# Patient Record
Sex: Male | Born: 1940 | Race: White | Hispanic: No
Health system: Southern US, Community
[De-identification: ages and names within clinical notes are randomized; demographics above are authoritative.]

## PROBLEM LIST (undated history)

## (undated) HISTORY — PX: PACEMAKER INSERTION: SHX728

---

## 2014-11-06 ENCOUNTER — Other Ambulatory Visit (HOSPITAL_COMMUNITY): Payer: Medicare HMO

## 2014-11-06 ENCOUNTER — Inpatient Hospital Stay
Admission: AD | Admit: 2014-11-06 | Discharge: 2014-12-12 | Disposition: A | Discharge status: Home Health | Payer: Medicare HMO | Source: Ambulatory Visit | Attending: Internal Medicine | Admitting: Internal Medicine

## 2014-11-06 DIAGNOSIS — R7989 Other specified abnormal findings of blood chemistry: Secondary | ICD-10-CM | POA: Insufficient documentation

## 2014-11-06 DIAGNOSIS — N179 Acute kidney failure, unspecified: Secondary | ICD-10-CM

## 2014-11-06 DIAGNOSIS — M009 Pyogenic arthritis, unspecified: Secondary | ICD-10-CM

## 2014-11-06 DIAGNOSIS — Z95828 Presence of other vascular implants and grafts: Secondary | ICD-10-CM

## 2014-11-06 DIAGNOSIS — M549 Dorsalgia, unspecified: Secondary | ICD-10-CM

## 2014-11-06 DIAGNOSIS — R748 Abnormal levels of other serum enzymes: Secondary | ICD-10-CM

## 2014-11-06 DIAGNOSIS — R609 Edema, unspecified: Secondary | ICD-10-CM

## 2014-11-06 DIAGNOSIS — Z8739 Personal history of other diseases of the musculoskeletal system and connective tissue: Secondary | ICD-10-CM

## 2014-11-06 DIAGNOSIS — K5649 Other impaction of intestine: Secondary | ICD-10-CM

## 2014-11-06 DIAGNOSIS — D696 Thrombocytopenia, unspecified: Secondary | ICD-10-CM | POA: Insufficient documentation

## 2014-11-06 DIAGNOSIS — K2971 Gastritis, unspecified, with bleeding: Secondary | ICD-10-CM | POA: Insufficient documentation

## 2014-11-06 DIAGNOSIS — D469 Myelodysplastic syndrome, unspecified: Secondary | ICD-10-CM | POA: Insufficient documentation

## 2014-11-06 DIAGNOSIS — K2970 Gastritis, unspecified, without bleeding: Secondary | ICD-10-CM | POA: Insufficient documentation

## 2014-11-06 DIAGNOSIS — R195 Other fecal abnormalities: Secondary | ICD-10-CM | POA: Insufficient documentation

## 2014-11-06 DIAGNOSIS — R945 Abnormal results of liver function studies: Secondary | ICD-10-CM

## 2014-11-06 DIAGNOSIS — R52 Pain, unspecified: Secondary | ICD-10-CM

## 2014-11-06 DIAGNOSIS — Z789 Other specified health status: Secondary | ICD-10-CM

## 2014-11-06 DIAGNOSIS — L0291 Cutaneous abscess, unspecified: Secondary | ICD-10-CM

## 2014-11-06 DIAGNOSIS — R1011 Right upper quadrant pain: Secondary | ICD-10-CM | POA: Insufficient documentation

## 2014-11-06 DIAGNOSIS — J189 Pneumonia, unspecified organism: Secondary | ICD-10-CM

## 2014-11-06 DIAGNOSIS — D62 Acute posthemorrhagic anemia: Secondary | ICD-10-CM | POA: Insufficient documentation

## 2014-11-07 LAB — CBC
HEMATOCRIT: 25.1 % — AB (ref 39.0–52.0)
HEMOGLOBIN: 8.3 g/dL — AB (ref 13.0–17.0)
MCH: 29.1 pg (ref 26.0–34.0)
MCHC: 33.1 g/dL (ref 30.0–36.0)
MCV: 88.1 fL (ref 78.0–100.0)
Platelets: 16 10*3/uL — CL (ref 150–400)
RBC: 2.85 MIL/uL — ABNORMAL LOW (ref 4.22–5.81)
RDW: 17.7 % — ABNORMAL HIGH (ref 11.5–15.5)
WBC: 15.7 10*3/uL — ABNORMAL HIGH (ref 4.0–10.5)

## 2014-11-07 LAB — URINALYSIS, ROUTINE W REFLEX MICROSCOPIC
BILIRUBIN URINE: NEGATIVE
Glucose, UA: NEGATIVE mg/dL
Hgb urine dipstick: NEGATIVE
KETONES UR: NEGATIVE mg/dL
Leukocytes, UA: NEGATIVE
NITRITE: NEGATIVE
PROTEIN: NEGATIVE mg/dL
Specific Gravity, Urine: 1.012 (ref 1.005–1.030)
UROBILINOGEN UA: 2 mg/dL — AB (ref 0.0–1.0)
pH: 6 (ref 5.0–8.0)

## 2014-11-07 LAB — ABO/RH: ABO/RH(D): A POS

## 2014-11-07 LAB — COMPREHENSIVE METABOLIC PANEL
ALBUMIN: 2.4 g/dL — AB (ref 3.5–5.0)
ALT: 19 U/L (ref 17–63)
AST: 19 U/L (ref 15–41)
Alkaline Phosphatase: 97 U/L (ref 38–126)
Anion gap: 8 (ref 5–15)
BUN: 18 mg/dL (ref 6–20)
CHLORIDE: 98 mmol/L — AB (ref 101–111)
CO2: 27 mmol/L (ref 22–32)
CREATININE: 0.7 mg/dL (ref 0.61–1.24)
Calcium: 9 mg/dL (ref 8.9–10.3)
GFR calc Af Amer: >60 mL/min (ref >60)
GFR calc non Af Amer: >60 mL/min (ref >60)
Glucose, Bld: 96 mg/dL (ref 65–99)
POTASSIUM: 4.2 mmol/L (ref 3.5–5.1)
SODIUM: 133 mmol/L — AB (ref 135–145)
Total Bilirubin: 1.9 mg/dL — ABNORMAL HIGH (ref 0.3–1.2)
Total Protein: 7 g/dL (ref 6.5–8.1)

## 2014-11-07 LAB — CK: Total CK: 17 U/L — ABNORMAL LOW (ref 49–397)

## 2014-11-07 LAB — PROTIME-INR
INR: 1.32 (ref 0.00–1.49)
Prothrombin Time: 16.5 seconds — ABNORMAL HIGH (ref 11.6–15.2)

## 2014-11-07 LAB — PROCALCITONIN: PROCALCITONIN: 0.19 ng/mL

## 2014-11-07 LAB — PHOSPHORUS: Phosphorus: 4.7 mg/dL — ABNORMAL HIGH (ref 2.5–4.6)

## 2014-11-07 LAB — MAGNESIUM: MAGNESIUM: 1.9 mg/dL (ref 1.7–2.4)

## 2014-11-07 LAB — TSH: TSH: 2.396 u[IU]/mL (ref 0.350–4.500)

## 2014-11-07 LAB — PREPARE RBC (CROSSMATCH)

## 2014-11-07 LAB — SEDIMENTATION RATE: Sed Rate: >140 mm/hr — ABNORMAL HIGH (ref 0–16)

## 2014-11-07 LAB — C-REACTIVE PROTEIN: CRP: 7.5 mg/dL — AB (ref <1.0)

## 2014-11-08 ENCOUNTER — Other Ambulatory Visit (HOSPITAL_COMMUNITY): Payer: Medicare HMO

## 2014-11-08 LAB — TYPE AND SCREEN
ABO/RH(D): A POS
Antibody Screen: NEGATIVE
UNIT DIVISION: 0
Unit division: 0

## 2014-11-08 LAB — CBC WITH DIFFERENTIAL/PLATELET
BASOS ABS: 0 10*3/uL (ref 0.0–0.1)
BASOS ABS: 0 10*3/uL (ref 0.0–0.1)
Basophils Relative: 0 %
Basophils Relative: 0 %
EOS ABS: 0 10*3/uL (ref 0.0–0.7)
EOS ABS: 0 10*3/uL (ref 0.0–0.7)
EOS PCT: 0 %
Eosinophils Relative: 0 %
HCT: 20.2 % — ABNORMAL LOW (ref 39.0–52.0)
HEMATOCRIT: 24.4 % — AB (ref 39.0–52.0)
Hemoglobin: 6.7 g/dL — CL (ref 13.0–17.0)
Hemoglobin: 8.3 g/dL — ABNORMAL LOW (ref 13.0–17.0)
LYMPHS ABS: 0.4 10*3/uL — AB (ref 0.7–4.0)
LYMPHS ABS: 0.6 10*3/uL — AB (ref 0.7–4.0)
Lymphocytes Relative: 4 %
Lymphocytes Relative: 5 %
MCH: 29.6 pg (ref 26.0–34.0)
MCH: 30.1 pg (ref 26.0–34.0)
MCHC: 33.2 g/dL (ref 30.0–36.0)
MCHC: 34 g/dL (ref 30.0–36.0)
MCV: 89.4 fL (ref 78.0–100.0)
MCV: 88.4 fL (ref 78.0–100.0)
MONO ABS: 0.1 10*3/uL (ref 0.1–1.0)
MONO ABS: 0.1 10*3/uL (ref 0.1–1.0)
Monocytes Relative: 1 %
Monocytes Relative: 1 %
NEUTROS ABS: 10.3 10*3/uL — AB (ref 1.7–7.7)
NEUTROS PCT: 95 %
Neutro Abs: 10.5 10*3/uL — ABNORMAL HIGH (ref 1.7–7.7)
Neutrophils Relative %: 94 %
PLATELETS: 17 10*3/uL — AB (ref 150–400)
PLATELETS: 16 10*3/uL — AB (ref 150–400)
RBC: 2.26 MIL/uL — AB (ref 4.22–5.81)
RBC: 2.76 MIL/uL — AB (ref 4.22–5.81)
RDW: 18.1 % — AB (ref 11.5–15.5)
RDW: 18 % — AB (ref 11.5–15.5)
WBC: 11 10*3/uL — AB (ref 4.0–10.5)
WBC: 11 10*3/uL — AB (ref 4.0–10.5)

## 2014-11-08 LAB — BASIC METABOLIC PANEL
ANION GAP: 8 (ref 5–15)
BUN: 18 mg/dL (ref 6–20)
CALCIUM: 8.5 mg/dL — AB (ref 8.9–10.3)
CO2: 27 mmol/L (ref 22–32)
CREATININE: 0.74 mg/dL (ref 0.61–1.24)
Chloride: 94 mmol/L — ABNORMAL LOW (ref 101–111)
Glucose, Bld: 97 mg/dL (ref 65–99)
Potassium: 4 mmol/L (ref 3.5–5.1)
Sodium: 129 mmol/L — ABNORMAL LOW (ref 135–145)

## 2014-11-08 LAB — HEMOGLOBIN A1C
HEMOGLOBIN A1C: 6.3 % — AB (ref 4.8–5.6)
MEAN PLASMA GLUCOSE: 134 mg/dL

## 2014-11-08 LAB — OCCULT BLOOD X 1 CARD TO LAB, STOOL: Fecal Occult Bld: POSITIVE — AB

## 2014-11-08 LAB — MAGNESIUM: Magnesium: 1.9 mg/dL (ref 1.7–2.4)

## 2014-11-08 LAB — PHOSPHORUS: PHOSPHORUS: 4.5 mg/dL (ref 2.5–4.6)

## 2014-11-08 NOTE — Consult Note (Signed)
Reason for Consult:rule out sepsis left knee status post irrigation and debridement at [facility] approximately one month ago. Referring Physician: Dr Jaynie Bream is an 74 y.o. male.  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 Patient was hospitalized for an infected Port-A-Cath on the right. Patient had multiple complications during his hospital stay including cellulitis of the right elbow and an effusion of the left knee. Patient underwent arthroscopic irrigation and debridement left knee. His white cell count in the knee was 10,000 cultures were negative. Patient reports a 2-1/2 year history of arthritis of the left knee and was told he was not a surgical candidate for total knee replacement due to his multiple medical problems.  No past medical history on file.  No past surgical history on file.  No family history on file.  Social History:  has no tobacco, alcohol, and drug history on file.  Allergies: Allergies not on file  Medications: I have reviewed the patient's current medications.  Results for orders placed or performed during the hospital encounter of 11/06/14 (from the past 48 hour(s))  CBC with Differential/Platelet     Status: Abnormal   Collection Time: 11/07/14  5:40 AM  Result Value Ref Range   WBC 11.0 (H) 4.0 - 10.5 K/uL    Comment: REPEATED TO VERIFY   RBC 2.26 (L) 4.22 - 5.81 MIL/uL   Hemoglobin 6.7 (LL) 13.0 - 17.0 g/dL    Comment: SPECIMEN CHECKED FOR CLOTS REPEATED TO VERIFY CRITICAL RESULT CALLED TO, READ BACK BY AND VERIFIED WITH: CHAVIS, ASHLEY,RN 0802 11/07/2014 BY MACEDA, J    HCT 20.2 (L) 39.0 - 52.0 %   MCV 89.4 78.0 - 100.0 fL   MCH 29.6 26.0 - 34.0 pg   MCHC 33.2 30.0 - 36.0 g/dL   RDW 18.1 (H) 11.5 - 15.5 %   Platelets 17 (LL) 150 - 400 K/uL    Comment: PLATELET COUNT CONFIRMED BY SMEAR REPEATED TO VERIFY CRITICAL RESULT CALLED TO, READ BACK BY AND VERIFIED WITH: ASHLEY CHAVIS,RN AT 0756 BY J  MACEDA SPECIMEN CHECKED FOR CLOTS    Neutrophils Relative % 95 %   Lymphocytes Relative 4 %   Monocytes Relative 1 %   Eosinophils Relative 0 %   Basophils Relative 0 %   Neutro Abs 10.5 (H) 1.7 - 7.7 K/uL   Lymphs Abs 0.4 (L) 0.7 - 4.0 K/uL   Monocytes Absolute 0.1 0.1 - 1.0 K/uL   Eosinophils Absolute 0.0 0.0 - 0.7 K/uL   Basophils Absolute 0.0 0.0 - 0.1 K/uL   WBC Morphology DOHLE BODIES     Comment: HYPERSEGMENTED NEUT  Comprehensive metabolic panel     Status: Abnormal   Collection Time: 11/07/14  5:40 AM  Result Value Ref Range   Sodium 133 (L) 135 - 145 mmol/L   Potassium 4.2 3.5 - 5.1 mmol/L   Chloride 98 (L) 101 - 111 mmol/L   CO2 27 22 - 32 mmol/L   Glucose, Bld 96 65 - 99 mg/dL   BUN 18 6 - 20 mg/dL   Creatinine, Ser 0.70 0.61 - 1.24 mg/dL   Calcium 9.0 8.9 - 10.3 mg/dL   Total Protein 7.0 6.5 - 8.1 g/dL   Albumin 2.4 (L) 3.5 - 5.0 g/dL   AST 19 15 - 41 U/L   ALT 19 17 - 63 U/L   Alkaline Phosphatase 97 38 - 126 U/L   Total Bilirubin 1.9 (H) 0.3 - 1.2 mg/dL   GFR  calc non Af Amer >60 >60 mL/min   GFR calc Af Amer >60 >60 mL/min    Comment: (NOTE) The eGFR has been calculated using the CKD EPI equation. This calculation has not been validated in all clinical situations. eGFR's persistently <60 mL/min signify possible Chronic Kidney Disease.    Anion gap 8 5 - 15  Magnesium     Status: None   Collection Time: 11/07/14  5:40 AM  Result Value Ref Range   Magnesium 1.9 1.7 - 2.4 mg/dL  Phosphorus     Status: Abnormal   Collection Time: 11/07/14  5:40 AM  Result Value Ref Range   Phosphorus 4.7 (H) 2.5 - 4.6 mg/dL  Protime-INR     Status: Abnormal   Collection Time: 11/07/14  5:40 AM  Result Value Ref Range   Prothrombin Time 16.5 (H) 11.6 - 15.2 seconds   INR 1.32 0.00 - 1.49  C-reactive protein     Status: Abnormal   Collection Time: 11/07/14  5:40 AM  Result Value Ref Range   CRP 7.5 (H) <1.0 mg/dL  TSH     Status: None   Collection Time: 11/07/14   5:40 AM  Result Value Ref Range   TSH 2.396 0.350 - 4.500 uIU/mL  Sedimentation rate     Status: Abnormal   Collection Time: 11/07/14  5:40 AM  Result Value Ref Range   Sed Rate >140 (H) 0 - 16 mm/hr  Hemoglobin A1c     Status: Abnormal   Collection Time: 11/07/14  5:40 AM  Result Value Ref Range   Hgb A1c MFr Bld 6.3 (H) 4.8 - 5.6 %    Comment: (NOTE)         Pre-diabetes: 5.7 - 6.4         Diabetes: >6.4         Glycemic control for adults with diabetes: <7.0    Mean Plasma Glucose 134 mg/dL    Comment: (NOTE) Performed At: [facility] [place], [place] [id number] Lindon Romp MD [id number]   CK     Status: Abnormal   Collection Time: 11/07/14  5:40 AM  Result Value Ref Range   Total CK 17 (L) 49 - 397 U/L  Procalcitonin     Status: None   Collection Time: 11/07/14  5:40 AM  Result Value Ref Range   Procalcitonin 0.19 ng/mL    Comment:        Interpretation: PCT (Procalcitonin) <= 0.5 ng/mL: Systemic infection (sepsis) is not likely. Local bacterial infection is possible. (NOTE)         ICU PCT Algorithm               Non ICU PCT Algorithm    ----------------------------     ------------------------------         PCT < 0.25 ng/mL                 PCT < 0.1 ng/mL     Stopping of antibiotics            Stopping of antibiotics       strongly encouraged.               strongly encouraged.    ----------------------------     ------------------------------       PCT level decrease by               PCT < 0.25 ng/mL       >= 80% from  peak PCT       OR PCT 0.25 - 0.5 ng/mL          Stopping of antibiotics                                             encouraged.     Stopping of antibiotics           encouraged.    ----------------------------     ------------------------------       PCT level decrease by              PCT >= 0.25 ng/mL       < 80% from peak PCT        AND PCT >= 0.5 ng/mL            Continuin g antibiotics                                               encouraged.       Continuing antibiotics            encouraged.    ----------------------------     ------------------------------     PCT level increase compared          PCT > 0.5 ng/mL         with peak PCT AND          PCT >= 0.5 ng/mL             Escalation of antibiotics                                          strongly encouraged.      Escalation of antibiotics        strongly encouraged.   Urinalysis, Routine w reflex microscopic (not at [facility])     Status: Abnormal   Collection Time: 11/07/14  6:16 AM  Result Value Ref Range   Color, Urine YELLOW YELLOW   APPearance CLEAR CLEAR   Specific Gravity, Urine 1.012 1.005 - 1.030   pH 6.0 5.0 - 8.0   Glucose, UA NEGATIVE NEGATIVE mg/dL   Hgb urine dipstick NEGATIVE NEGATIVE   Bilirubin Urine NEGATIVE NEGATIVE   Ketones, ur NEGATIVE NEGATIVE mg/dL   Protein, ur NEGATIVE NEGATIVE mg/dL   Urobilinogen, UA 2.0 (H) 0.0 - 1.0 mg/dL   Nitrite NEGATIVE NEGATIVE   Leukocytes, UA NEGATIVE NEGATIVE    Comment: MICROSCOPIC NOT DONE ON URINES WITH NEGATIVE PROTEIN, BLOOD, LEUKOCYTES, NITRITE, OR GLUCOSE <1000 mg/dL.  Type and screen [facility]     Status: None (Preliminary result)   Collection Time: 11/07/14 10:57 AM  Result Value Ref Range   ABO/RH(D) A POS    Antibody Screen NEG    Sample Expiration 11/10/2014    Unit Number [id number]    Blood Component Type RED CELLS,LR    Unit division 00    Status of Unit ISSUED    Transfusion Status OK TO TRANSFUSE    Crossmatch Result Compatible    Unit Number [id number]    Blood Component Type RBC LR PHER1    Unit division 00    Status of  Unit ISSUED    Transfusion Status OK TO TRANSFUSE    Crossmatch Result Compatible   Prepare RBC     Status: None   Collection Time: 11/07/14 10:57 AM  Result Value Ref Range   Order Confirmation ORDER PROCESSED BY BLOOD BANK   ABO/Rh     Status: None   Collection Time: 11/07/14 10:57 AM  Result Value Ref Range    ABO/RH(D) A POS   CBC     Status: Abnormal   Collection Time: 11/07/14  8:00 PM  Result Value Ref Range   WBC 15.7 (H) 4.0 - 10.5 K/uL   RBC 2.85 (L) 4.22 - 5.81 MIL/uL   Hemoglobin 8.3 (L) 13.0 - 17.0 g/dL   HCT 25.1 (L) 39.0 - 52.0 %   MCV 88.1 78.0 - 100.0 fL   MCH 29.1 26.0 - 34.0 pg   MCHC 33.1 30.0 - 36.0 g/dL   RDW 17.7 (H) 11.5 - 15.5 %   Platelets 16 (LL) 150 - 400 K/uL    Comment: REPEATED TO VERIFY CRITICAL VALUE NOTED.  VALUE IS CONSISTENT WITH PREVIOUSLY REPORTED AND CALLED VALUE.   CBC with Differential/Platelet     Status: Abnormal (Preliminary result)   Collection Time: 11/08/14  6:00 AM  Result Value Ref Range   WBC 11.0 (H) 4.0 - 10.5 K/uL   RBC 2.76 (L) 4.22 - 5.81 MIL/uL   Hemoglobin 8.3 (L) 13.0 - 17.0 g/dL   HCT 24.4 (L) 39.0 - 52.0 %   MCV 88.4 78.0 - 100.0 fL   MCH 30.1 26.0 - 34.0 pg   MCHC 34.0 30.0 - 36.0 g/dL   RDW 18.0 (H) 11.5 - 15.5 %   Platelets 16 (LL) 150 - 400 K/uL    Comment: REPEATED TO VERIFY CRITICAL VALUE NOTED.  VALUE IS CONSISTENT WITH PREVIOUSLY REPORTED AND CALLED VALUE.    Neutrophils Relative % PENDING %   Neutro Abs PENDING 1.7 - 7.7 K/uL   Band Neutrophils PENDING %   Lymphocytes Relative PENDING %   Lymphs Abs PENDING 0.7 - 4.0 K/uL   Monocytes Relative PENDING %   Monocytes Absolute PENDING 0.1 - 1.0 K/uL   Eosinophils Relative PENDING %   Eosinophils Absolute PENDING 0.0 - 0.7 K/uL   Basophils Relative PENDING %   Basophils Absolute PENDING 0.0 - 0.1 K/uL   WBC Morphology PENDING    RBC Morphology PENDING    Smear Review PENDING    nRBC PENDING 0 /100 WBC   Metamyelocytes Relative PENDING %   Myelocytes PENDING %   Promyelocytes Absolute PENDING %   Blasts PENDING %    Dg Chest Port 1 View  11/06/2014  CLINICAL DATA:  74 year old male recently status post chest wall debridement. Initial encounter. EXAM: PORTABLE CHEST 1 VIEW COMPARISON:  [facility] PICC line image 10 /06/2014, chest  radiographs 10/17/2014. FINDINGS: Portable AP semi upright view at 2246 hours. Right side PICC line remains in place. Left side chest cardiac AICD again noted. Stable cardiac size and mediastinal contours. No pneumothorax, pulmonary edema, pleural effusion or acute pulmonary opacity. Patchy left lateral lung base opacity appears stable since 10/17/14. IMPRESSION: Stable chest.  No acute cardiopulmonary abnormality. Electronically Signed   By: Genevie Ann M.D.   On: 11/06/2014 23:15    Review of Systems  All other systems reviewed and are negative.  There were no vitals taken for this visit. Physical Exam On examination patient's left lower extremity he has swelling around the left  knee compared to the right. There is some warmth there is no redness no cellulitis no drainage patient has some pain with range of motion but there is no tense effusion. Patient is generalized tenderness to palpation along the entire tibial crest on the left. Assessment/Plan: Assessment: Left knee pain with 2-1/2 year history of osteoarthritis with aspiration showing a white cell count of only 10,000 and cultures negative.  Plan: We will order radiographs and an MRI scan of the left knee.. Do not see an indication for further debridement of the left knee at this time.  Zeidy Tayag V 11/08/2014, 7:04 AM

## 2014-11-09 ENCOUNTER — Encounter: Payer: Self-pay | Admitting: Radiology

## 2014-11-09 NOTE — Progress Notes (Signed)
Patient ID: Christopher Hood, male   DOB: 07/23/1940, 74 y.o.   MRN: [id number] I have reviewed the patient's radiographs of his left knee. 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 No bony destructive changes consistent with osteomyelitis. Clinical findings were also consistent with osteoarthritis. MRI pending.

## 2014-11-10 ENCOUNTER — Other Ambulatory Visit (HOSPITAL_COMMUNITY): Payer: Medicare HMO

## 2014-11-10 ENCOUNTER — Encounter: Payer: Self-pay | Admitting: Radiology

## 2014-11-10 LAB — CBC
HCT: 22.9 % — ABNORMAL LOW (ref 39.0–52.0)
Hemoglobin: 7.7 g/dL — ABNORMAL LOW (ref 13.0–17.0)
MCH: 29.8 pg (ref 26.0–34.0)
MCHC: 33.6 g/dL (ref 30.0–36.0)
MCV: 88.8 fL (ref 78.0–100.0)
PLATELETS: 16 10*3/uL — AB (ref 150–400)
RBC: 2.58 MIL/uL — ABNORMAL LOW (ref 4.22–5.81)
RDW: 17.7 % — AB (ref 11.5–15.5)
WBC: 1.5 10*3/uL — ABNORMAL LOW (ref 4.0–10.5)

## 2014-11-10 LAB — CBC WITH DIFFERENTIAL/PLATELET
BASOS ABS: 0 10*3/uL (ref 0.0–0.1)
BASOS PCT: 0 %
EOS ABS: 0 10*3/uL (ref 0.0–0.7)
Eosinophils Relative: 1 %
HEMATOCRIT: 23 % — AB (ref 39.0–52.0)
HEMOGLOBIN: 7.7 g/dL — AB (ref 13.0–17.0)
Lymphocytes Relative: 26 %
Lymphs Abs: 0.4 10*3/uL — ABNORMAL LOW (ref 0.7–4.0)
MCH: 29.8 pg (ref 26.0–34.0)
MCHC: 33.5 g/dL (ref 30.0–36.0)
MCV: 89.1 fL (ref 78.0–100.0)
Monocytes Absolute: 0.1 10*3/uL (ref 0.1–1.0)
Monocytes Relative: 6 %
NEUTROS ABS: 1 10*3/uL — AB (ref 1.7–7.7)
Neutrophils Relative %: 67 %
Platelets: 19 10*3/uL — CL (ref 150–400)
RBC: 2.58 MIL/uL — ABNORMAL LOW (ref 4.22–5.81)
RDW: 17.6 % — AB (ref 11.5–15.5)
WBC: 1.5 10*3/uL — ABNORMAL LOW (ref 4.0–10.5)

## 2014-11-10 LAB — BASIC METABOLIC PANEL
ANION GAP: 9 (ref 5–15)
BUN: 23 mg/dL — ABNORMAL HIGH (ref 6–20)
CALCIUM: 8.6 mg/dL — AB (ref 8.9–10.3)
CO2: 25 mmol/L (ref 22–32)
Chloride: 93 mmol/L — ABNORMAL LOW (ref 101–111)
Creatinine, Ser: 0.77 mg/dL (ref 0.61–1.24)
Glucose, Bld: 101 mg/dL — ABNORMAL HIGH (ref 65–99)
Potassium: 4.1 mmol/L (ref 3.5–5.1)
SODIUM: 127 mmol/L — AB (ref 135–145)

## 2014-11-10 MED ORDER — IOHEXOL 300 MG/ML  SOLN
100.0000 mL | Freq: Once | INTRAMUSCULAR | Status: AC | PRN
  Administered 2014-11-10: 100 mL via INTRAVENOUS

## 2014-11-11 LAB — CBC WITH DIFFERENTIAL/PLATELET
BASOS ABS: 0 10*3/uL (ref 0.0–0.1)
Band Neutrophils: 0 %
Basophils Relative: 0 %
EOS ABS: 0 10*3/uL (ref 0.0–0.7)
Eosinophils Relative: 0 %
HEMATOCRIT: 20.9 % — AB (ref 39.0–52.0)
HEMOGLOBIN: 7.2 g/dL — AB (ref 13.0–17.0)
LYMPHS ABS: 0 10*3/uL — AB (ref 0.7–4.0)
Lymphocytes Relative: 0 %
MCH: 30.3 pg (ref 26.0–34.0)
MCHC: 34.4 g/dL (ref 30.0–36.0)
MCV: 87.8 fL (ref 78.0–100.0)
MONOS PCT: 0 %
Monocytes Absolute: 0 10*3/uL — ABNORMAL LOW (ref 0.1–1.0)
NEUTROS PCT: 0 %
NRBC: 0 /100{WBCs}
Platelets: 14 10*3/uL — CL (ref 150–400)
RBC: 2.38 MIL/uL — AB (ref 4.22–5.81)
RDW: 17.2 % — ABNORMAL HIGH (ref 11.5–15.5)
WBC: 0.6 10*3/uL — AB (ref 4.0–10.5)

## 2014-11-11 LAB — URINE CULTURE: Culture: 50000

## 2014-11-11 LAB — C-REACTIVE PROTEIN: CRP: 12.8 mg/dL — AB (ref <1.0)

## 2014-11-11 LAB — URIC ACID: URIC ACID, SERUM: 2.6 mg/dL — AB (ref 4.4–7.6)

## 2014-11-11 LAB — CK: Total CK: 16 U/L — ABNORMAL LOW (ref 49–397)

## 2014-11-11 LAB — SEDIMENTATION RATE

## 2014-11-12 LAB — CBC WITH DIFFERENTIAL/PLATELET
Basophils Absolute: 0 10*3/uL (ref 0.0–0.1)
Basophils Relative: 0 %
Eosinophils Absolute: 0 10*3/uL (ref 0.0–0.7)
Eosinophils Relative: 0 %
HCT: 22.7 % — ABNORMAL LOW (ref 39.0–52.0)
Hemoglobin: 7.6 g/dL — ABNORMAL LOW (ref 13.0–17.0)
Lymphocytes Relative: 1 %
Lymphs Abs: 0.1 10*3/uL — ABNORMAL LOW (ref 0.7–4.0)
MCH: 29.3 pg (ref 26.0–34.0)
MCHC: 33.5 g/dL (ref 30.0–36.0)
MCV: 87.6 fL (ref 78.0–100.0)
Monocytes Absolute: 0.1 10*3/uL (ref 0.1–1.0)
Monocytes Relative: 1 %
Neutro Abs: 6.7 10*3/uL (ref 1.7–7.7)
Neutrophils Relative %: 98 %
Platelets: 20 10*3/uL — CL (ref 150–400)
RBC: 2.59 MIL/uL — ABNORMAL LOW (ref 4.22–5.81)
RDW: 17.1 % — ABNORMAL HIGH (ref 11.5–15.5)
WBC: 6.9 10*3/uL (ref 4.0–10.5)

## 2014-11-12 LAB — RENAL FUNCTION PANEL
Albumin: 2.4 g/dL — ABNORMAL LOW (ref 3.5–5.0)
Anion gap: 9 (ref 5–15)
BUN: 25 mg/dL — ABNORMAL HIGH (ref 6–20)
CO2: 26 mmol/L (ref 22–32)
Calcium: 8.8 mg/dL — ABNORMAL LOW (ref 8.9–10.3)
Chloride: 94 mmol/L — ABNORMAL LOW (ref 101–111)
Creatinine, Ser: 0.66 mg/dL (ref 0.61–1.24)
GFR calc Af Amer: >60 mL/min
GFR calc non Af Amer: >60 mL/min
Glucose, Bld: 169 mg/dL — ABNORMAL HIGH (ref 65–99)
Phosphorus: 5.3 mg/dL — ABNORMAL HIGH (ref 2.5–4.6)
Potassium: 4.2 mmol/L (ref 3.5–5.1)
Sodium: 129 mmol/L — ABNORMAL LOW (ref 135–145)

## 2014-11-12 LAB — RHEUMATOID FACTOR

## 2014-11-13 LAB — PATHOLOGIST SMEAR REVIEW

## 2014-11-14 ENCOUNTER — Other Ambulatory Visit (HOSPITAL_COMMUNITY): Payer: Medicare HMO

## 2014-11-14 LAB — CBC WITH DIFFERENTIAL/PLATELET
BAND NEUTROPHILS: 0 %
BASOS ABS: 0 10*3/uL (ref 0.0–0.1)
BASOS PCT: 0 %
Blasts: 0 %
EOS ABS: 0 10*3/uL (ref 0.0–0.7)
Eosinophils Relative: 0 %
HCT: 21.1 % — ABNORMAL LOW (ref 39.0–52.0)
HEMOGLOBIN: 7.1 g/dL — AB (ref 13.0–17.0)
Lymphocytes Relative: 3 %
Lymphs Abs: 0.6 10*3/uL — ABNORMAL LOW (ref 0.7–4.0)
MCH: 30.1 pg (ref 26.0–34.0)
MCHC: 33.6 g/dL (ref 30.0–36.0)
MCV: 89.4 fL (ref 78.0–100.0)
MONO ABS: 0.2 10*3/uL (ref 0.1–1.0)
MYELOCYTES: 0 %
Metamyelocytes Relative: 0 %
Monocytes Relative: 1 %
Neutro Abs: 18.8 10*3/uL — ABNORMAL HIGH (ref 1.7–7.7)
Neutrophils Relative %: 96 %
Other: 0 %
PROMYELOCYTES ABS: 0 %
Platelets: 21 10*3/uL — CL (ref 150–400)
RBC: 2.36 MIL/uL — ABNORMAL LOW (ref 4.22–5.81)
RDW: 17.8 % — ABNORMAL HIGH (ref 11.5–15.5)
WBC: 19.6 10*3/uL — ABNORMAL HIGH (ref 4.0–10.5)
nRBC: 0 /100 WBC

## 2014-11-14 LAB — PREPARE RBC (CROSSMATCH)

## 2014-11-15 LAB — CBC WITH DIFFERENTIAL/PLATELET
BASOS ABS: 0 10*3/uL (ref 0.0–0.1)
BASOS PCT: 0 %
EOS ABS: 0 10*3/uL (ref 0.0–0.7)
Eosinophils Relative: 0 %
HEMATOCRIT: 23.3 % — AB (ref 39.0–52.0)
HEMOGLOBIN: 7.7 g/dL — AB (ref 13.0–17.0)
Lymphocytes Relative: 6 %
Lymphs Abs: 0.5 10*3/uL — ABNORMAL LOW (ref 0.7–4.0)
MCH: 29.3 pg (ref 26.0–34.0)
MCHC: 33 g/dL (ref 30.0–36.0)
MCV: 88.6 fL (ref 78.0–100.0)
MONO ABS: 0.1 10*3/uL (ref 0.1–1.0)
MONOS PCT: 2 %
NEUTROS ABS: 7 10*3/uL (ref 1.7–7.7)
Neutrophils Relative %: 92 %
Platelets: 14 10*3/uL — CL (ref 150–400)
RBC: 2.63 MIL/uL — ABNORMAL LOW (ref 4.22–5.81)
RDW: 18.2 % — AB (ref 11.5–15.5)
WBC: 7.7 10*3/uL (ref 4.0–10.5)

## 2014-11-15 LAB — BASIC METABOLIC PANEL
ANION GAP: 9 (ref 5–15)
BUN: 40 mg/dL — ABNORMAL HIGH (ref 6–20)
CALCIUM: 8.9 mg/dL (ref 8.9–10.3)
CO2: 29 mmol/L (ref 22–32)
CREATININE: 1.04 mg/dL (ref 0.61–1.24)
Chloride: 97 mmol/L — ABNORMAL LOW (ref 101–111)
GFR calc non Af Amer: >60 mL/min (ref >60)
Glucose, Bld: 95 mg/dL (ref 65–99)
Potassium: 4.4 mmol/L (ref 3.5–5.1)
SODIUM: 135 mmol/L (ref 135–145)

## 2014-11-16 LAB — CULTURE, BLOOD (ROUTINE X 2)
CULTURE: NO GROWTH
Culture: NO GROWTH

## 2014-11-16 LAB — TYPE AND SCREEN
ABO/RH(D): A POS
ANTIBODY SCREEN: NEGATIVE
UNIT DIVISION: 0
UNIT DIVISION: 0

## 2014-11-16 LAB — RENAL FUNCTION PANEL
Albumin: 2.8 g/dL — ABNORMAL LOW (ref 3.5–5.0)
Anion gap: 8 (ref 5–15)
BUN: 24 mg/dL — ABNORMAL HIGH (ref 6–20)
CHLORIDE: 97 mmol/L — AB (ref 101–111)
CO2: 28 mmol/L (ref 22–32)
CREATININE: 0.68 mg/dL (ref 0.61–1.24)
Calcium: 9.2 mg/dL (ref 8.9–10.3)
Glucose, Bld: 119 mg/dL — ABNORMAL HIGH (ref 65–99)
PHOSPHORUS: 4.9 mg/dL — AB (ref 2.5–4.6)
POTASSIUM: 4.6 mmol/L (ref 3.5–5.1)
Sodium: 133 mmol/L — ABNORMAL LOW (ref 135–145)

## 2014-11-16 LAB — CBC WITH DIFFERENTIAL/PLATELET
BASOS ABS: 0 10*3/uL (ref 0.0–0.1)
Basophils Relative: 0 %
Eosinophils Absolute: 0 10*3/uL (ref 0.0–0.7)
Eosinophils Relative: 0 %
HEMATOCRIT: 23.5 % — AB (ref 39.0–52.0)
HEMOGLOBIN: 7.7 g/dL — AB (ref 13.0–17.0)
LYMPHS PCT: 17 %
Lymphs Abs: 0.4 10*3/uL — ABNORMAL LOW (ref 0.7–4.0)
MCH: 28.7 pg (ref 26.0–34.0)
MCHC: 32.8 g/dL (ref 30.0–36.0)
MCV: 87.7 fL (ref 78.0–100.0)
Monocytes Absolute: 0.1 10*3/uL (ref 0.1–1.0)
Monocytes Relative: 4 %
NEUTROS ABS: 1.7 10*3/uL (ref 1.7–7.7)
NEUTROS PCT: 80 %
Platelets: 18 10*3/uL — CL (ref 150–400)
RBC: 2.68 MIL/uL — AB (ref 4.22–5.81)
RDW: 18 % — ABNORMAL HIGH (ref 11.5–15.5)
WBC: 2.1 10*3/uL — AB (ref 4.0–10.5)

## 2014-11-18 LAB — CBC WITH DIFFERENTIAL/PLATELET
BASOS ABS: 0 10*3/uL (ref 0.0–0.1)
BASOS PCT: 0 %
Eosinophils Absolute: 0 10*3/uL (ref 0.0–0.7)
Eosinophils Relative: 0 %
HEMATOCRIT: 24.8 % — AB (ref 39.0–52.0)
Hemoglobin: 8 g/dL — ABNORMAL LOW (ref 13.0–17.0)
LYMPHS PCT: 4 %
Lymphs Abs: 0.8 10*3/uL (ref 0.7–4.0)
MCH: 28.9 pg (ref 26.0–34.0)
MCHC: 32.3 g/dL (ref 30.0–36.0)
MCV: 89.5 fL (ref 78.0–100.0)
Monocytes Absolute: 0.4 10*3/uL (ref 0.1–1.0)
Monocytes Relative: 2 %
NEUTROS ABS: 17.7 10*3/uL — AB (ref 1.7–7.7)
Neutrophils Relative %: 93 %
PLATELETS: 18 10*3/uL — AB (ref 150–400)
RBC: 2.77 MIL/uL — ABNORMAL LOW (ref 4.22–5.81)
RDW: 17.9 % — ABNORMAL HIGH (ref 11.5–15.5)
WBC: 19 10*3/uL — AB (ref 4.0–10.5)

## 2014-11-18 LAB — BASIC METABOLIC PANEL
ANION GAP: 12 (ref 5–15)
BUN: 28 mg/dL — ABNORMAL HIGH (ref 6–20)
CO2: 25 mmol/L (ref 22–32)
Calcium: 9.2 mg/dL (ref 8.9–10.3)
Chloride: 96 mmol/L — ABNORMAL LOW (ref 101–111)
Creatinine, Ser: 0.98 mg/dL (ref 0.61–1.24)
GLUCOSE: 88 mg/dL (ref 65–99)
POTASSIUM: 4.4 mmol/L (ref 3.5–5.1)
Sodium: 133 mmol/L — ABNORMAL LOW (ref 135–145)

## 2014-11-19 ENCOUNTER — Other Ambulatory Visit (HOSPITAL_COMMUNITY): Payer: Medicare HMO

## 2014-11-20 ENCOUNTER — Other Ambulatory Visit (HOSPITAL_COMMUNITY): Payer: Medicare HMO

## 2014-11-20 LAB — CBC WITH DIFFERENTIAL/PLATELET
BASOS PCT: 0 %
BLASTS: 0 %
Band Neutrophils: 0 %
Basophils Absolute: 0 10*3/uL (ref 0.0–0.1)
Eosinophils Absolute: 0 10*3/uL (ref 0.0–0.7)
Eosinophils Relative: 0 %
HEMATOCRIT: 22.6 % — AB (ref 39.0–52.0)
Hemoglobin: 7.3 g/dL — ABNORMAL LOW (ref 13.0–17.0)
LYMPHS PCT: 4 %
Lymphs Abs: 0.7 10*3/uL (ref 0.7–4.0)
MCH: 29.2 pg (ref 26.0–34.0)
MCHC: 32.3 g/dL (ref 30.0–36.0)
MCV: 90.4 fL (ref 78.0–100.0)
MONO ABS: 0.7 10*3/uL (ref 0.1–1.0)
MONOS PCT: 4 %
Metamyelocytes Relative: 0 %
Myelocytes: 0 %
NEUTROS ABS: 16.3 10*3/uL — AB (ref 1.7–7.7)
NEUTROS PCT: 92 %
NRBC: 0 /100{WBCs}
OTHER: 0 %
PLATELETS: 17 10*3/uL — AB (ref 150–400)
PROMYELOCYTES ABS: 0 %
RBC: 2.5 MIL/uL — ABNORMAL LOW (ref 4.22–5.81)
RDW: 18.7 % — AB (ref 11.5–15.5)
WBC: 17.7 10*3/uL — ABNORMAL HIGH (ref 4.0–10.5)

## 2014-11-20 LAB — BASIC METABOLIC PANEL
ANION GAP: 7 (ref 5–15)
BUN: 32 mg/dL — ABNORMAL HIGH (ref 6–20)
CALCIUM: 9 mg/dL (ref 8.9–10.3)
CO2: 26 mmol/L (ref 22–32)
Chloride: 102 mmol/L (ref 101–111)
Creatinine, Ser: 1.08 mg/dL (ref 0.61–1.24)
Glucose, Bld: 88 mg/dL (ref 65–99)
POTASSIUM: 4.2 mmol/L (ref 3.5–5.1)
Sodium: 135 mmol/L (ref 135–145)

## 2014-11-20 LAB — CK TOTAL AND CKMB (NOT AT ARMC)
CK TOTAL: 11 U/L — AB (ref 49–397)
CK, MB: 2.6 ng/mL (ref 0.5–5.0)
RELATIVE INDEX: INVALID (ref 0.0–2.5)

## 2014-11-20 MED ORDER — IOHEXOL 300 MG/ML  SOLN: 100.0000 mL | Freq: Once | INTRAMUSCULAR | Status: DC | PRN

## 2014-11-20 NOTE — Progress Notes (Signed)
Patient ID: Christopher Hood, male   DOB: [birth year], 74 y.o.   MRN: [id number] Patient is status post irrigation and debridement of his left knee. Initial white cell count was 10,000 cultures were negative. 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 With the possibility of previous septic joint I will order an MRI scan to further evaluate the medial joint line. Patient has pain with weightbearing. On examination there is no cellulitis no warmth no redness no effusion no clinical signs of infection. Patient has pain with weightbearing and patient may benefit from arthroscopic debridement of the left knee.

## 2014-11-21 LAB — HEMOGLOBIN AND HEMATOCRIT, BLOOD
HEMATOCRIT: 21.8 % — AB (ref 39.0–52.0)
HEMOGLOBIN: 7.1 g/dL — AB (ref 13.0–17.0)

## 2014-11-21 NOTE — Progress Notes (Signed)
Patient ID: Christopher Hood, male   DOB: 12-07-1940, 73 y.o.   MRN: [id number] Patient's CT scan is reviewed. CT scan shows significant destructive lytic changes from chronic osteoarthritis. Discussed the patient can be weightbearing as tolerated but patient will not be able to straighten his knee and will not be able to weight-bear comfortably. Patient will eventually need a total knee replacement when he is healthy.

## 2014-11-22 LAB — BASIC METABOLIC PANEL
ANION GAP: 9 (ref 5–15)
BUN: 28 mg/dL — ABNORMAL HIGH (ref 6–20)
CHLORIDE: 95 mmol/L — AB (ref 101–111)
CO2: 25 mmol/L (ref 22–32)
Calcium: 8.5 mg/dL — ABNORMAL LOW (ref 8.9–10.3)
Creatinine, Ser: 1.07 mg/dL (ref 0.61–1.24)
Glucose, Bld: 94 mg/dL (ref 65–99)
POTASSIUM: 4.2 mmol/L (ref 3.5–5.1)
SODIUM: 129 mmol/L — AB (ref 135–145)

## 2014-11-22 LAB — CBC WITH DIFFERENTIAL/PLATELET
Basophils Absolute: 0 10*3/uL (ref 0.0–0.1)
Basophils Relative: 0 %
Eosinophils Absolute: 0.1 10*3/uL (ref 0.0–0.7)
Eosinophils Relative: 1 %
HCT: 21.8 % — ABNORMAL LOW (ref 39.0–52.0)
Hemoglobin: 7 g/dL — ABNORMAL LOW (ref 13.0–17.0)
LYMPHS PCT: 8 %
Lymphs Abs: 0.5 10*3/uL — ABNORMAL LOW (ref 0.7–4.0)
MCH: 29.3 pg (ref 26.0–34.0)
MCHC: 32.1 g/dL (ref 30.0–36.0)
MCV: 91.2 fL (ref 78.0–100.0)
MONO ABS: 0.2 10*3/uL (ref 0.1–1.0)
Monocytes Relative: 3 %
NEUTROS PCT: 88 %
Neutro Abs: 6 10*3/uL (ref 1.7–7.7)
PLATELETS: 17 10*3/uL — AB (ref 150–400)
RBC: 2.39 MIL/uL — AB (ref 4.22–5.81)
RDW: 19.1 % — ABNORMAL HIGH (ref 11.5–15.5)
WBC: 6.8 10*3/uL (ref 4.0–10.5)

## 2014-11-23 LAB — CBC WITH DIFFERENTIAL/PLATELET
BASOS PCT: 0 %
BLASTS: 0 %
Band Neutrophils: 7 %
Basophils Absolute: 0 10*3/uL (ref 0.0–0.1)
Eosinophils Absolute: 0 10*3/uL (ref 0.0–0.7)
Eosinophils Relative: 0 %
HCT: 20.6 % — ABNORMAL LOW (ref 39.0–52.0)
HEMOGLOBIN: 6.6 g/dL — AB (ref 13.0–17.0)
LYMPHS PCT: 3 %
Lymphs Abs: 0.5 10*3/uL — ABNORMAL LOW (ref 0.7–4.0)
MCH: 28.9 pg (ref 26.0–34.0)
MCHC: 32 g/dL (ref 30.0–36.0)
MCV: 90.4 fL (ref 78.0–100.0)
MONO ABS: 0.4 10*3/uL (ref 0.1–1.0)
MONOS PCT: 2 %
Metamyelocytes Relative: 0 %
Myelocytes: 0 %
NEUTROS PCT: 88 %
NRBC: 0 /100{WBCs}
Neutro Abs: 16.9 10*3/uL — ABNORMAL HIGH (ref 1.7–7.7)
OTHER: 0 %
PLATELETS: 20 10*3/uL — AB (ref 150–400)
PROMYELOCYTES ABS: 0 %
RBC: 2.28 MIL/uL — ABNORMAL LOW (ref 4.22–5.81)
RDW: 19.5 % — ABNORMAL HIGH (ref 11.5–15.5)
WBC: 17.8 10*3/uL — ABNORMAL HIGH (ref 4.0–10.5)

## 2014-11-23 LAB — PREPARE RBC (CROSSMATCH)

## 2014-11-24 LAB — CBC
HEMATOCRIT: 23.3 % — AB (ref 39.0–52.0)
Hemoglobin: 8 g/dL — ABNORMAL LOW (ref 13.0–17.0)
MCH: 30.5 pg (ref 26.0–34.0)
MCHC: 34.3 g/dL (ref 30.0–36.0)
MCV: 88.9 fL (ref 78.0–100.0)
Platelets: 17 10*3/uL — CL (ref 150–400)
RBC: 2.62 MIL/uL — AB (ref 4.22–5.81)
RDW: 18.9 % — ABNORMAL HIGH (ref 11.5–15.5)
WBC: 21.3 10*3/uL — AB (ref 4.0–10.5)

## 2014-11-25 LAB — TYPE AND SCREEN
ABO/RH(D): A POS
ANTIBODY SCREEN: NEGATIVE
Unit division: 0
Unit division: 0

## 2014-11-25 LAB — CBC WITH DIFFERENTIAL/PLATELET
BAND NEUTROPHILS: 0 %
BASOS ABS: 0 10*3/uL (ref 0.0–0.1)
BASOS PCT: 0 %
Blasts: 0 %
EOS ABS: 0 10*3/uL (ref 0.0–0.7)
EOS PCT: 0 %
HCT: 21.7 % — ABNORMAL LOW (ref 39.0–52.0)
Hemoglobin: 7.3 g/dL — ABNORMAL LOW (ref 13.0–17.0)
LYMPHS ABS: 0.9 10*3/uL (ref 0.7–4.0)
Lymphocytes Relative: 4 %
MCH: 29.9 pg (ref 26.0–34.0)
MCHC: 33.6 g/dL (ref 30.0–36.0)
MCV: 88.9 fL (ref 78.0–100.0)
METAMYELOCYTES PCT: 0 %
MONO ABS: 0.4 10*3/uL (ref 0.1–1.0)
MONOS PCT: 2 %
MYELOCYTES: 0 %
NEUTROS ABS: 20 10*3/uL — AB (ref 1.7–7.7)
Neutrophils Relative %: 94 %
Other: 0 %
PLATELETS: 20 10*3/uL — AB (ref 150–400)
Promyelocytes Absolute: 0 %
RBC: 2.44 MIL/uL — ABNORMAL LOW (ref 4.22–5.81)
RDW: 18.9 % — AB (ref 11.5–15.5)
WBC: 21.3 10*3/uL — AB (ref 4.0–10.5)
nRBC: 0 /100 WBC

## 2014-11-25 LAB — BASIC METABOLIC PANEL
ANION GAP: 8 (ref 5–15)
BUN: 30 mg/dL — ABNORMAL HIGH (ref 6–20)
CALCIUM: 8.6 mg/dL — AB (ref 8.9–10.3)
CO2: 24 mmol/L (ref 22–32)
Chloride: 96 mmol/L — ABNORMAL LOW (ref 101–111)
Creatinine, Ser: 1.24 mg/dL (ref 0.61–1.24)
GFR, EST NON AFRICAN AMERICAN: 56 mL/min — AB (ref >60)
GLUCOSE: 95 mg/dL (ref 65–99)
Potassium: 4.5 mmol/L (ref 3.5–5.1)
SODIUM: 128 mmol/L — AB (ref 135–145)

## 2014-11-26 LAB — URINALYSIS, ROUTINE W REFLEX MICROSCOPIC
BILIRUBIN URINE: NEGATIVE
GLUCOSE, UA: 100 mg/dL — AB
HGB URINE DIPSTICK: NEGATIVE
KETONES UR: NEGATIVE mg/dL
NITRITE: NEGATIVE
PH: 5 (ref 5.0–8.0)
Protein, ur: NEGATIVE mg/dL
SPECIFIC GRAVITY, URINE: 1.018 (ref 1.005–1.030)
Urobilinogen, UA: 1 mg/dL (ref 0.0–1.0)

## 2014-11-26 LAB — URINE MICROSCOPIC-ADD ON

## 2014-11-27 ENCOUNTER — Other Ambulatory Visit (HOSPITAL_COMMUNITY): Payer: Medicare HMO

## 2014-11-27 LAB — CBC WITH DIFFERENTIAL/PLATELET
BLASTS: 0 %
Band Neutrophils: 0 %
Basophils Absolute: 0 10*3/uL (ref 0.0–0.1)
Basophils Relative: 0 %
EOS PCT: 0 %
Eosinophils Absolute: 0 10*3/uL (ref 0.0–0.7)
HEMATOCRIT: 19.9 % — AB (ref 39.0–52.0)
HEMOGLOBIN: 6.9 g/dL — AB (ref 13.0–17.0)
LYMPHS ABS: 0.2 10*3/uL — AB (ref 0.7–4.0)
Lymphocytes Relative: 1 %
MCH: 30.8 pg (ref 26.0–34.0)
MCHC: 34.7 g/dL (ref 30.0–36.0)
MCV: 88.8 fL (ref 78.0–100.0)
Metamyelocytes Relative: 0 %
Monocytes Absolute: 0 10*3/uL — ABNORMAL LOW (ref 0.1–1.0)
Monocytes Relative: 0 %
Myelocytes: 0 %
NEUTROS ABS: 22 10*3/uL — AB (ref 1.7–7.7)
NRBC: 0 /100{WBCs}
Neutrophils Relative %: 99 %
OTHER: 0 %
Platelets: 22 10*3/uL — CL (ref 150–400)
Promyelocytes Absolute: 0 %
RBC: 2.24 MIL/uL — AB (ref 4.22–5.81)
RDW: 19.5 % — AB (ref 11.5–15.5)
WBC: 22.2 10*3/uL — AB (ref 4.0–10.5)

## 2014-11-27 LAB — URINE CULTURE: Culture: >=100000

## 2014-11-27 LAB — CBC
HEMATOCRIT: 20.4 % — AB (ref 39.0–52.0)
HEMOGLOBIN: 6.8 g/dL — AB (ref 13.0–17.0)
MCH: 29.8 pg (ref 26.0–34.0)
MCHC: 33.3 g/dL (ref 30.0–36.0)
MCV: 89.5 fL (ref 78.0–100.0)
Platelets: 24 10*3/uL — CL (ref 150–400)
RBC: 2.28 MIL/uL — ABNORMAL LOW (ref 4.22–5.81)
RDW: 19.2 % — AB (ref 11.5–15.5)
WBC: 20.4 10*3/uL — AB (ref 4.0–10.5)

## 2014-11-27 LAB — PREPARE RBC (CROSSMATCH)

## 2014-11-27 LAB — RENAL FUNCTION PANEL
ANION GAP: 10 (ref 5–15)
Albumin: 2.3 g/dL — ABNORMAL LOW (ref 3.5–5.0)
BUN: 62 mg/dL — AB (ref 6–20)
CHLORIDE: 99 mmol/L — AB (ref 101–111)
CO2: 22 mmol/L (ref 22–32)
Calcium: 8.7 mg/dL — ABNORMAL LOW (ref 8.9–10.3)
Creatinine, Ser: 2.51 mg/dL — ABNORMAL HIGH (ref 0.61–1.24)
GFR calc Af Amer: 27 mL/min — ABNORMAL LOW (ref >60)
GFR calc non Af Amer: 24 mL/min — ABNORMAL LOW (ref >60)
GLUCOSE: 107 mg/dL — AB (ref 65–99)
PHOSPHORUS: 6.9 mg/dL — AB (ref 2.5–4.6)
POTASSIUM: 4.8 mmol/L (ref 3.5–5.1)
Sodium: 131 mmol/L — ABNORMAL LOW (ref 135–145)

## 2014-11-28 DIAGNOSIS — D6189 Other specified aplastic anemias and other bone marrow failure syndromes: Secondary | ICD-10-CM | POA: Diagnosis not present

## 2014-11-28 DIAGNOSIS — D696 Thrombocytopenia, unspecified: Secondary | ICD-10-CM

## 2014-11-28 DIAGNOSIS — R109 Unspecified abdominal pain: Secondary | ICD-10-CM

## 2014-11-28 DIAGNOSIS — D469 Myelodysplastic syndrome, unspecified: Secondary | ICD-10-CM | POA: Diagnosis not present

## 2014-11-28 DIAGNOSIS — R195 Other fecal abnormalities: Secondary | ICD-10-CM

## 2014-11-28 LAB — CBC
HCT: 21.9 % — ABNORMAL LOW (ref 39.0–52.0)
Hemoglobin: 7.4 g/dL — ABNORMAL LOW (ref 13.0–17.0)
MCH: 30.1 pg (ref 26.0–34.0)
MCHC: 33.8 g/dL (ref 30.0–36.0)
MCV: 89 fL (ref 78.0–100.0)
PLATELETS: 14 10*3/uL — AB (ref 150–400)
RBC: 2.46 MIL/uL — AB (ref 4.22–5.81)
RDW: 17.6 % — ABNORMAL HIGH (ref 11.5–15.5)
WBC: 19.9 10*3/uL — AB (ref 4.0–10.5)

## 2014-11-28 LAB — RENAL FUNCTION PANEL
ALBUMIN: 2.2 g/dL — AB (ref 3.5–5.0)
Anion gap: 9 (ref 5–15)
BUN: 63 mg/dL — AB (ref 6–20)
CALCIUM: 8.1 mg/dL — AB (ref 8.9–10.3)
CO2: 22 mmol/L (ref 22–32)
CREATININE: 2.33 mg/dL — AB (ref 0.61–1.24)
Chloride: 96 mmol/L — ABNORMAL LOW (ref 101–111)
GFR, EST AFRICAN AMERICAN: 30 mL/min — AB (ref >60)
GFR, EST NON AFRICAN AMERICAN: 26 mL/min — AB (ref >60)
Glucose, Bld: 103 mg/dL — ABNORMAL HIGH (ref 65–99)
Phosphorus: 5.6 mg/dL — ABNORMAL HIGH (ref 2.5–4.6)
Potassium: 4.8 mmol/L (ref 3.5–5.1)
SODIUM: 127 mmol/L — AB (ref 135–145)

## 2014-11-28 NOTE — Consult Note (Signed)
[facility] Consult: 12:03 PM 11/28/2014     Referring Provider: Dr. Laren Everts  Primary Care Physician:  ? Primary hematologist:  Jacqulyn Ducking MD in [facility]. Primary Gastroenterologist:  Althia Forts     Reason for Consultation:  Melenic stools. Transfusion requiring anemia.   HPI: Christopher Hood is a 74 y.o. male.  History of myelodysplastic syndrome/aplastic anemia.  Severe thrombocytopenia noted in other hospitals records. This is treated with Procrit and Neupogen, both of which are ongoing medications.  S/P porcine MVR and single-vessel CABG in 2014, at [facility]. S/P ICD/pacemaker.  Ejection fraction presurgery was 45%. Osteoarthritis.  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  DM type II. In the early 1970s he underwent what sounds like a vagotomy/pyloroplasty for peptic ulcer disease. He is not aware of subsequent testing for presence of helical factor pylori. About 10 years ago, so ~ 2006, he had partial colectomy for what sounds like complicated diverticulitis.  History of colon polyps. Last colonoscopy was at least 10 years ago. All GI surgeries were in [place].   For at least 10 years patient had been requiring blood transfusions every 6 months or so. In the last year or so the need for transfusions has accelerated. It got to where he needed transfusions every 2 weeks as an outpatient starting around 04/2014. A some point a porta cath was placed to facilitate labs and infusions. For many years patient had been treated with B12 shots and periodic parenteral iron infusions. Starting around 04/2014 he was begun on a regimen of Procrit and Neupogen.  Dr. Laverle Hobby performed bone marrow biopsy in 06/2014.  This showed  features suggestive of, but not diagnostic of myelodysplasia.  On follow-up visits Dr. Laverle Hobby felt that he had progressive myelodysplastic syndrome and scored between a 5 and 6 on the prognostic scoring system which is a high to very high risk score. Median survival calculated at 0.8 - 1.6 years, medium time to 25% AML Evolution of 0.7-1.4 years. Dr. Laverle Hobby provided him with prescription for Augmentin to use in case he developed temperatures greater than 100.4. In his clinic note of 07/19/14, Dr. Laverle Hobby mentions possibility of initiating "hypomethylator therapy".  [facility] admission April 2015 with neutropenic fever, grew enterococcus from blood. [facility] admission August 2015 with similar issues [facility] admission 9/14 - 11/06/14 with chest wall cellulitis of the chest wall from infected Port-A-Cath.  Also had problems with effusions of the left knee and right elbow.  s/p chest wall debridement and wash out of the left knee. Chest wall cultures grew out coag-negative staph. Required transfusion of both PRBCs as well as platelets on multiple occasions.  New onset dysphagia treated with chopped/dysphagia 2 diet.  Transferred to [facility] on 10/10. He has required a total of 8 units of packed red blood cells, 2 of these were given yesterday when the hemoglobin was 6.8 and hemoglobin today is 7.4.  Hemoglobin range is 7-8 generally. Stool has been dark and tests FOBT  positive.   Patient's platelets today are 14K  This is somewhat reduced from previous range of 17-20 K on assays earlier this month.  11/07/14 urine grew out 50 K of Enterobacter, specimen from 11/26/2014 is growing yeast.  In last few days pt's stools were "chocolate" dark, but has also had constipation and no BM since the weekend.  Minor tenderness in RUQ, no n/v.  Some heartburn.  Appetite is depressed dating back at least 3-4 weeks. Some early satiety. Chronic occasional globus.  Diet advanced to thins/mech soft, and he tolerates this.      Prior to Admission  medications   Not on File  See HPI  Scheduled Meds: Amiodarone.  Atorvastatin.  Carvedilol.  Ciprofloxacin by mouth.  Ensure pudding.  Granix infusion daily.  Procrit every MWF. Lidoderm patch Protonix 40 mg by mouth twice a day Oxycodone Protein supplement MVI Vitamin C Oral zinc supplement  Topical Voltaren gel     Allergies as of 11/06/2014  . (Not on File)    No family history on file.  Social History   Social History  . Marital Status: Unknown    Spouse Name: N/A  . Number of Children: N/A  . Years of Education: N/A   Occupational History  . Not on file.   Social History Main Topics  . Smoking status: Not on file  . Smokeless tobacco: Not on file  . Alcohol Use: Not on file  . Drug Use: Not on file  . Sexual Activity: Not on file   Other Topics Concern  . Not on file   Social History Narrative    REVIEW OF SYSTEMS: Constitutional:   patient remembers weighing 289 pounds in the beginning of September. His current weight is 232 pounds.  Feels weak and very 15. ENT:  No nose bleeds Pulm:   no shortness of breath, no cough. CV:  No palpitations, no LE edema.  no chest pain  GU:  No hematuria, no frequency GI:  Per HPI Heme:  Per HPI   Transfusions:  Per HPI Neuro:  No headaches, no peripheral tingling or numbness Derm:  No itching, no rash or sores.  Endocrine:  No sweats or chills.  No polyuria or dysuria Immunization:  Not queried.  Travel:   traveled as far as [place] within the last 6-8 months.    PHYSICAL EXAM: Vital signs in last 24 hours: There were no vitals filed for this visit. Wt Readings from Last 3 Encounters:  No data found for Wt    General:  Patient looks chronically ill, obese. He is comfortable.  Positive pallor and ashen facial coloring Head:  No facial asymmetry or swelling.  Eyes:  conjunctiva pale. No scleral icterus Ears:   not hard of hearing  Nose:   no congestion or discharge Mouth:   most of his teeth  are missing. The remaining teeth are in poor repair with lots of plaque. Neck:   no mass, no JVD, no TMG. Lungs:   there is large a bandage covering the upper right chest was some bruising evident at the margins of the bandage. Heart: RRR. No MRG. Abdomen:   obese. Bowel sounds active. Not distended, not protuberant. No masses. No hepatosplenomegaly..   Rectal: Deferred.   Musc/Skeltl: no gross joint deformities. Extremities:  no CCE.  Neurologic:  patient oriented 3. He is alert. He is able to move all 4 limbs. Limb strength was not tested. Skin:   no obvious sores or rashes.  No telangiectasia. Tattoos:   none seen. Nodes:   no cervical adenopathy.   Psych:   pleasant, cooperative. Calm.  Intake/Output from previous day:   Intake/Output this shift:    LAB RESULTS:  Recent Labs  11/27/14 0600 11/27/14 0830 11/28/14 0655  WBC 22.2* 20.4* 19.9*  HGB 6.9* 6.8* 7.4*  HCT 19.9* 20.4* 21.9*  PLT 22* 24* 14*   BMET Lab Results  Component Value Date   NA 127* 11/28/2014   NA 131* 11/27/2014   NA 128* 11/25/2014   K 4.8 11/28/2014   K 4.8 11/27/2014   K 4.5 11/25/2014   CL 96* 11/28/2014   CL 99* 11/27/2014   CL 96* 11/25/2014   CO2 22 11/28/2014   CO2 22 11/27/2014   CO2 24 11/25/2014   GLUCOSE 103* 11/28/2014   GLUCOSE 107* 11/27/2014   GLUCOSE 95 11/25/2014   BUN 63* 11/28/2014   BUN 62* 11/27/2014   BUN 30* 11/25/2014   CREATININE 2.33* 11/28/2014   CREATININE 2.51* 11/27/2014   CREATININE 1.24 11/25/2014   CALCIUM 8.1* 11/28/2014   CALCIUM 8.7* 11/27/2014   CALCIUM 8.6* 11/25/2014   LFT  Recent Labs  11/27/14 0600 11/28/14 0655  ALBUMIN 2.3* 2.2*   PT/INR Lab Results  Component Value Date   INR 1.32 11/07/2014   Hepatitis Panel No results for input(s): HEPBSAG, HCVAB, HEPAIGM, HEPBIGM in the last 72 hours. C-Diff No components found for: CDIFF Lipase  No results found for: LIPASE  Drugs of Abuse  No results found for: LABOPIA, COCAINSCRNUR,  LABBENZ, AMPHETMU, THCU, LABBARB   RADIOLOGY STUDIES: US Renal  11/27/2014  CLINICAL DATA:  Acute kidney injury. EXAM: RENAL / URINARY TRACT ULTRASOUND COMPLETE COMPARISON:  Abdomen and pelvis CT dated 09/18/2014. FINDINGS: Right Kidney: Length: 13.0 cm. Echogenicity within normal limits. No mass or hydronephrosis visualized. Left Kidney: Length: 14.7 cm. Echogenicity within normal limits. No mass or hydronephrosis visualized. Bladder: Appears normal for degree of bladder distention. IMPRESSION: Normal examination. Electronically Signed   By: Claudie Revering M.D.   On: 11/27/2014 15:55    ENDOSCOPIC STUDIES: Colonoscopy approximately 10 years previously.  IMPRESSION:   *  Myelodysplastic syndrome/aplastic anemia.  currently with ongoing anemia and worsening thrombocytopenia. Ongoing requirement for red cell transfusions at  [facility] and at [facility] as well as Neupogen. With platelet levels of 14,000, spontaneous mucosal bleeding is also likely.  *  Dark, FOBT positive stool. Rule out vascular ectasia, rule out ulcers, rule out neoplasia. Remote history of what sounds like a vagotomy/pyloroplasty for bleeding ulcer disease. Remote partial colectomy for what sounds like complications of diverticular disease.  *  Port-A-Cath related chest wall cellulitis as well as elbow cellulitis and knee effusion.  Port-A-Cath removed during 9/16 admission at [facility].  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  *  Hyponatremia.    PLAN:     *  Currently his platelets are too low to allow for any sort of endoscopic intervention.  *  Wonder if the patient should be transferred back to the care of his hematologist, Dr. Jacqulyn Ducking, for further management of his significant hematologic issues.   Azucena Freed  11/28/2014, 12:03 PM Pager: [phone]

## 2014-11-29 ENCOUNTER — Other Ambulatory Visit (HOSPITAL_COMMUNITY): Payer: Medicare HMO

## 2014-11-29 LAB — RENAL FUNCTION PANEL
ANION GAP: 11 (ref 5–15)
Albumin: 2.2 g/dL — ABNORMAL LOW (ref 3.5–5.0)
BUN: 57 mg/dL — ABNORMAL HIGH (ref 6–20)
CO2: 21 mmol/L — AB (ref 22–32)
Calcium: 8.4 mg/dL — ABNORMAL LOW (ref 8.9–10.3)
Chloride: 100 mmol/L — ABNORMAL LOW (ref 101–111)
Creatinine, Ser: 2.02 mg/dL — ABNORMAL HIGH (ref 0.61–1.24)
GFR calc Af Amer: 36 mL/min — ABNORMAL LOW (ref >60)
GFR, EST NON AFRICAN AMERICAN: 31 mL/min — AB (ref >60)
GLUCOSE: 110 mg/dL — AB (ref 65–99)
POTASSIUM: 5.1 mmol/L (ref 3.5–5.1)
Phosphorus: 4.9 mg/dL — ABNORMAL HIGH (ref 2.5–4.6)
Sodium: 132 mmol/L — ABNORMAL LOW (ref 135–145)

## 2014-11-29 LAB — CBC WITH DIFFERENTIAL/PLATELET
BAND NEUTROPHILS: 0 %
BASOS ABS: 0 10*3/uL (ref 0.0–0.1)
BASOS PCT: 0 %
BLASTS: 0 %
BLASTS: 0 %
Band Neutrophils: 1 %
Basophils Absolute: 0 10*3/uL (ref 0.0–0.1)
Basophils Relative: 0 %
EOS ABS: 0 10*3/uL (ref 0.0–0.7)
Eosinophils Absolute: 0 10*3/uL (ref 0.0–0.7)
Eosinophils Relative: 0 %
Eosinophils Relative: 0 %
HEMATOCRIT: 20.5 % — AB (ref 39.0–52.0)
HEMATOCRIT: 23.3 % — AB (ref 39.0–52.0)
Hemoglobin: 6.9 g/dL — CL (ref 13.0–17.0)
Hemoglobin: 7.7 g/dL — ABNORMAL LOW (ref 13.0–17.0)
LYMPHS ABS: 0.4 10*3/uL — AB (ref 0.7–4.0)
LYMPHS PCT: 2 %
LYMPHS PCT: 3 %
Lymphs Abs: 0.3 10*3/uL — ABNORMAL LOW (ref 0.7–4.0)
MCH: 30.5 pg (ref 26.0–34.0)
MCH: 29.7 pg (ref 26.0–34.0)
MCHC: 33.7 g/dL (ref 30.0–36.0)
MCHC: 33 g/dL (ref 30.0–36.0)
MCV: 90.7 fL (ref 78.0–100.0)
MCV: 90 fL (ref 78.0–100.0)
MONOS PCT: 1 %
Metamyelocytes Relative: 0 %
Metamyelocytes Relative: 0 %
Monocytes Absolute: 0 10*3/uL — ABNORMAL LOW (ref 0.1–1.0)
Monocytes Absolute: 0.1 10*3/uL (ref 0.1–1.0)
Monocytes Relative: 0 %
Myelocytes: 0 %
Myelocytes: 0 %
NEUTROS ABS: 12.5 10*3/uL — AB (ref 1.7–7.7)
NEUTROS PCT: 97 %
NEUTROS PCT: 96 %
NRBC: 0 /100{WBCs}
NRBC: 0 /100{WBCs}
Neutro Abs: 12.7 10*3/uL — ABNORMAL HIGH (ref 1.7–7.7)
OTHER: 0 %
PROMYELOCYTES ABS: 0 %
Platelets: 30 10*3/uL — ABNORMAL LOW (ref 150–400)
Platelets: 31 10*3/uL — ABNORMAL LOW (ref 150–400)
Promyelocytes Absolute: 0 %
RBC: 2.26 MIL/uL — AB (ref 4.22–5.81)
RBC: 2.59 MIL/uL — ABNORMAL LOW (ref 4.22–5.81)
RDW: 18.2 % — AB (ref 11.5–15.5)
RDW: 17.3 % — AB (ref 11.5–15.5)
Smear Review: DECREASED
WBC: 13 10*3/uL — AB (ref 4.0–10.5)
WBC: 13 10*3/uL — ABNORMAL HIGH (ref 4.0–10.5)

## 2014-11-29 LAB — IRON AND TIBC
Iron: 45 ug/dL (ref 45–182)
Saturation Ratios: 33 % (ref 17.9–39.5)
TIBC: 137 ug/dL — ABNORMAL LOW (ref 250–450)
UIBC: 92 ug/dL

## 2014-11-29 LAB — PREPARE PLATELET PHERESIS: Unit division: 0

## 2014-11-29 LAB — FERRITIN: FERRITIN: 2751 ng/mL — AB (ref 24–336)

## 2014-11-29 LAB — PREPARE RBC (CROSSMATCH)

## 2014-11-29 NOTE — Progress Notes (Signed)
Case discussed this morning with Brittney the physician assistant caring for Mr. Christopher Hood I discussed our evaluation and recommendations yesterday She plans to contact the patient's hematologist Dr. Harlow Asa for his opinion and possibly consideration of transfer to the acute setting where hematology can be directly involved. Hematology at [facility] is not available to consult while he is in [facility] We discussed our recommendation for CT and she prefers to discuss the case with hematology first before further workup He received a platelet transfusion yesterday with increase in platelet count to 30,000 GI is available and Brittney stated her preference to call us as needed

## 2014-11-29 NOTE — Consult Note (Signed)
Reason for Consult: Open wound chest Referring Physician: Dr. Mariah Milling Date: 11.2.2016  Location: [place] is an 74 y.o. male.  HPI: Admitted 11/06/14 from [facility] for ongoing treatment. Has history of myeloplastic disorder and port a cath placed complicated by infection and removal. Asked to evaluate for possible coverage wound. Current adaptic over wound.  Currently receiving blood transfusion and per patient plan to transfer to [facility] tomorrow. GI has consulted and recommended endoscopy once platelets improved.   Hb a1c 6.3 no recent prealbumin  Past Surgical History  Procedure Laterality Date  . Pacemaker insertion      not MRI safe  CABG, MVR porcine Partial colectomy Possible vagotomy   Medications: I have reviewed the patient's current medications.  Results for orders placed or performed during the hospital encounter of 11/06/14 (from the past 48 hour(s))  CBC     Status: Abnormal   Collection Time: 11/28/14  6:55 AM  Result Value Ref Range   WBC 19.9 (H) 4.0 - 10.5 K/uL    Comment: REPEATED TO VERIFY   RBC 2.46 (L) 4.22 - 5.81 MIL/uL   Hemoglobin 7.4 (L) 13.0 - 17.0 g/dL    Comment: REPEATED TO VERIFY   HCT 21.9 (L) 39.0 - 52.0 %   MCV 89.0 78.0 - 100.0 fL   MCH 30.1 26.0 - 34.0 pg   MCHC 33.8 30.0 - 36.0 g/dL   RDW 17.6 (H) 11.5 - 15.5 %   Platelets 14 (LL) 150 - 400 K/uL    Comment: REPEATED TO VERIFY CRITICAL VALUE NOTED.  VALUE IS CONSISTENT WITH PREVIOUSLY REPORTED AND CALLED VALUE.   Renal function panel     Status: Abnormal   Collection Time: 11/28/14  6:55 AM  Result Value Ref Range   Sodium 127 (L) 135 - 145 mmol/L   Potassium 4.8 3.5 - 5.1 mmol/L   Chloride 96 (L) 101 - 111 mmol/L   CO2 22 22 - 32 mmol/L   Glucose, Bld 103 (H) 65 - 99 mg/dL   BUN 63 (H) 6 - 20 mg/dL   Creatinine, Ser 2.33 (H) 0.61 - 1.24 mg/dL   Calcium 8.1 (L) 8.9 - 10.3 mg/dL   Phosphorus 5.6 (H) 2.5 - 4.6 mg/dL   Albumin 2.2 (L)  3.5 - 5.0 g/dL   GFR calc non Af Amer 26 (L) >60 mL/min   GFR calc Af Amer 30 (L) >60 mL/min    Comment: (NOTE) The eGFR has been calculated using the CKD EPI equation. This calculation has not been validated in all clinical situations. eGFR's persistently <60 mL/min signify possible Chronic Kidney Disease.    Anion gap 9 5 - 15  Prepare Pheresed Platelets     Status: None   Collection Time: 11/28/14  5:35 PM  Result Value Ref Range   Unit Number [id number]    Blood Component Type PLTPHER LR2    Unit division 00    Status of Unit ISSUED,FINAL    Transfusion Status OK TO TRANSFUSE   CBC with Differential/Platelet     Status: Abnormal   Collection Time: 11/29/14  6:50 AM  Result Value Ref Range   WBC 13.0 (H) 4.0 - 10.5 K/uL   RBC 2.26 (L) 4.22 - 5.81 MIL/uL   Hemoglobin 6.9 (LL) 13.0 - 17.0 g/dL    Comment: REPEATED TO VERIFY CRITICAL RESULT CALLED TO, READ BACK BY AND VERIFIED WITH: OLIVIA STRADER,RN AT 0800 11/29/14 BY K BARR    HCT  20.5 (L) 39.0 - 52.0 %   MCV 90.7 78.0 - 100.0 fL   MCH 30.5 26.0 - 34.0 pg   MCHC 33.7 30.0 - 36.0 g/dL   RDW 18.2 (H) 11.5 - 15.5 %   Platelets 30 (L) 150 - 400 K/uL    Comment: CONSISTENT WITH PREVIOUS RESULT REPEATED TO VERIFY    Neutrophils Relative % 97 %   Lymphocytes Relative 2 %   Monocytes Relative 0 %   Eosinophils Relative 0 %   Basophils Relative 0 %   Band Neutrophils 1 %   Metamyelocytes Relative 0 %   Myelocytes 0 %   Promyelocytes Absolute 0 %   Blasts 0 %   nRBC 0 0 /100 WBC   Other 0 %   Neutro Abs 12.7 (H) 1.7 - 7.7 K/uL   Lymphs Abs 0.3 (L) 0.7 - 4.0 K/uL   Monocytes Absolute 0.0 (L) 0.1 - 1.0 K/uL   Eosinophils Absolute 0.0 0.0 - 0.7 K/uL   Basophils Absolute 0.0 0.0 - 0.1 K/uL  Prepare RBC     Status: None   Collection Time: 11/29/14 10:54 AM  Result Value Ref Range   Order Confirmation ORDER PROCESSED BY BLOOD BANK   Prepare Pheresed Platelets     Status: None (Preliminary result)   Collection Time:  11/29/14 10:56 AM  Result Value Ref Range   Unit Number [id number]    Blood Component Type PLTP LR1 PAS    Unit division 00    Status of Unit ISSUED    Transfusion Status OK TO TRANSFUSE   Renal function panel     Status: Abnormal   Collection Time: 11/29/14 12:09 PM  Result Value Ref Range   Sodium 132 (L) 135 - 145 mmol/L   Potassium 5.1 3.5 - 5.1 mmol/L   Chloride 100 (L) 101 - 111 mmol/L   CO2 21 (L) 22 - 32 mmol/L   Glucose, Bld 110 (H) 65 - 99 mg/dL   BUN 57 (H) 6 - 20 mg/dL   Creatinine, Ser 2.02 (H) 0.61 - 1.24 mg/dL   Calcium 8.4 (L) 8.9 - 10.3 mg/dL   Phosphorus 4.9 (H) 2.5 - 4.6 mg/dL   Albumin 2.2 (L) 3.5 - 5.0 g/dL   GFR calc non Af Amer 31 (L) >60 mL/min   GFR calc Af Amer 36 (L) >60 mL/min    Comment: (NOTE) The eGFR has been calculated using the CKD EPI equation. This calculation has not been validated in all clinical situations. eGFR's persistently <60 mL/min signify possible Chronic Kidney Disease.    Anion gap 11 5 - 15  Ferritin     Status: Abnormal   Collection Time: 11/29/14 12:09 PM  Result Value Ref Range   Ferritin 2751 (H) 24 - 336 ng/mL  Iron and TIBC     Status: Abnormal   Collection Time: 11/29/14 12:09 PM  Result Value Ref Range   Iron 45 45 - 182 ug/dL   TIBC 137 (L) 250 - 450 ug/dL   Saturation Ratios 33 17.9 - 39.5 %   UIBC 92 ug/dL     ROS Physical Exam Right chest port site without significant drainage, 100% granulation, evidence new epithelization edges wound  Assessment/Plan: Check prealbumin, nutrition follow up.  With regards to wound appears to be progressing. Do not anticipate surgical intervention or grafting. Recommend change wound care to collagen three times per week or medihoney three times per week. Patient himself has no desire for surgery, feels wound is  doing ok.   Irene Limbo, MD [facility] [phone]

## 2014-11-29 NOTE — Progress Notes (Signed)
Daily Rounding Note  11/30/2014, 9:24 AM    SUBJECTIVE:       No BMs for ~ 3 days.  Breathing ok.  No nausea.  Still with RUQ/to back pain, better when he lays flat.    OBJECTIVE:         Vital signs in last 24 hours:        There were no vitals filed for this visit. General: ill appearing, alert, frail.    Heart: RRR Chest: clear bil.  Some dyspnea Abdomen: soft, obese, ND.  Active BS.  Mild tenderness in RUQ  Extremities: no CCE Neuro/Psych:  Oriented x 3.  Alert, no cognitive impairment.  Moves all 4 limbs.   Intake/Output from previous day:    Intake/Output this shift:    Lab Results:  Recent Labs  11/28/14 0655 11/29/14 0650 11/29/14 2210  WBC 19.9* 13.0* 13.0*  HGB 7.4* 6.9* 7.7*  HCT 21.9* 20.5* 23.3*  PLT 14* 30* 31*   BMET  Recent Labs  11/28/14 0655 11/29/14 1209  NA 127* 132*  K 4.8 5.1  CL 96* 100*  CO2 22 21*  GLUCOSE 103* 110*  BUN 63* 57*  CREATININE 2.33* 2.02*  CALCIUM 8.1* 8.4*   LFT  Recent Labs  11/28/14 0655 11/29/14 1209  ALBUMIN 2.2* 2.2*    Studies/Results: Ct Abdomen Pelvis Wo Contrast  11/29/2014  CLINICAL DATA:  Right-sided abdominal pain. Abdominal aortic aneurysm. EXAM: CT ABDOMEN AND PELVIS WITHOUT CONTRAST TECHNIQUE: Multidetector CT imaging of the abdomen and pelvis was performed following the standard protocol without IV contrast. COMPARISON:  09/18/2014 FINDINGS: Lower chest: Stable cardiomegaly. Distal esophageal wall thickening again noted which is unchanged and suspicious for esophagitis. Hepatobiliary: No mass visualized on this un-enhanced exam. Gallbladder is unremarkable. Pancreas: No mass or inflammatory process identified on this un-enhanced exam. Spleen: Within normal limits in size. Adrenals/Urinary Tract: Tiny nonobstructive 2 mm calculus seen in lower pole of left kidney. No evidence hydronephrosis or ureteral calculi. No definite mass visualized  on this un-enhanced exam. Stomach/Bowel: Gastrojejunostomy is unremarkable in appearance on today's study. No evidence of obstruction, inflammatory process, or abnormal fluid collections. Previous right colectomy again demonstrated. Increased stool burden seen throughout the colon. Vascular/Lymphatic: No pathologically enlarged lymph nodes. 4.6 cm infrarenal abdominal aortic aneurysm shows no significant change in size. No evidence of aneurysm leak or rupture. Reproductive: No mass or other significant abnormality. Other: Small bilateral inguinal hernias again seen containing only fat. Musculoskeletal:  No suspicious bone lesions identified. IMPRESSION: Stable distal esophageal wall thickening, suspicious for esophagitis. Consider endoscopy for further evaluation. Stable 4.6 cm infrarenal abdominal aortic aneurysm. No evidence of aneurysm rupture. Recommend followup by abdomen and pelvis CTA in 6 months, and vascular surgery referral/consultation if not already obtained. This recommendation follows ACR consensus guidelines: White Paper of the ACR Incidental Findings Committee II on Vascular Findings. J Am Coll Radiol 2013; 10:789-794. Nonobstructive left nephrolithiasis. Electronically Signed   By: Earle Gell M.D.   On: 11/29/2014 15:00   Dg Chest Port 1 View  11/30/2014  CLINICAL DATA:  Right-sided chest discomfort, pneumonia, permanent pacemaker, history of CABG. EXAM: PORTABLE CHEST 1 VIEW COMPARISON:  Portable chest x-ray of November 06, 2014 FINDINGS: The lungs are adequately inflated. There are stable coarse lung markings at both bases. The cardiac silhouette is top-normal in size. The pulmonary vascularity is normal. The permanent pacemaker defibrillator is in stable position. There are post CABG changes. The bony  thorax exhibits no acute abnormality. IMPRESSION: Stable appearance of the chest since the previous study there is no acute cardiopulmonary abnormality. Electronically Signed   By: David  Martinique  M.D.   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 Platelets improved post transfusion.  S/p multiple PRBCs and platelets.   Non contrast CT suggests esophagitis.  [facility] MD added empiric Diflucan to PPI.    *  4.6 cm AAA.    *  CKD, AKI.    *  RUQ pain. CT unrevealing. Last LFTs 10/11: normal except T bil 1.9   *      PLAN   *  EBGD set for 2 PM today.  Give 2 platelets this AM, stat platelet count at 1130 , if at or above 50K then proceed to EGD, no EGD if platelets below that.   Hepatic fx profile at 1130, non stat.     Azucena Freed  11/30/2014, 9:24 AM Pager: [phone]

## 2014-11-30 ENCOUNTER — Other Ambulatory Visit (HOSPITAL_COMMUNITY): Payer: Medicare HMO

## 2014-11-30 ENCOUNTER — Encounter (HOSPITAL_COMMUNITY): Payer: Medicare HMO | Admitting: Anesthesiology

## 2014-11-30 ENCOUNTER — Encounter
Admission: AD | Disposition: A | Discharge status: Home Health | Payer: Self-pay | Source: Ambulatory Visit | Attending: Internal Medicine

## 2014-11-30 ENCOUNTER — Encounter (HOSPITAL_COMMUNITY): Payer: Self-pay | Admitting: *Deleted

## 2014-11-30 DIAGNOSIS — D62 Acute posthemorrhagic anemia: Secondary | ICD-10-CM | POA: Insufficient documentation

## 2014-11-30 DIAGNOSIS — D469 Myelodysplastic syndrome, unspecified: Secondary | ICD-10-CM | POA: Insufficient documentation

## 2014-11-30 DIAGNOSIS — D696 Thrombocytopenia, unspecified: Secondary | ICD-10-CM | POA: Insufficient documentation

## 2014-11-30 DIAGNOSIS — R195 Other fecal abnormalities: Secondary | ICD-10-CM | POA: Insufficient documentation

## 2014-11-30 HISTORY — PX: ESOPHAGOGASTRODUODENOSCOPY: SHX5428

## 2014-11-30 LAB — TYPE AND SCREEN
ABO/RH(D): A POS
Antibody Screen: NEGATIVE
Unit division: 0
Unit division: 0
Unit division: 0
Unit division: 0

## 2014-11-30 LAB — HEPATIC FUNCTION PANEL
ALBUMIN: 2.4 g/dL — AB (ref 3.5–5.0)
ALT: 29 U/L (ref 17–63)
AST: 31 U/L (ref 15–41)
Alkaline Phosphatase: 157 U/L — ABNORMAL HIGH (ref 38–126)
BILIRUBIN DIRECT: 2 mg/dL — AB (ref 0.1–0.5)
BILIRUBIN INDIRECT: 2.7 mg/dL — AB (ref 0.3–0.9)
BILIRUBIN TOTAL: 4.7 mg/dL — AB (ref 0.3–1.2)
Total Protein: 7.2 g/dL (ref 6.5–8.1)

## 2014-11-30 LAB — CBC
HEMATOCRIT: 26.2 % — AB (ref 39.0–52.0)
Hemoglobin: 8.5 g/dL — ABNORMAL LOW (ref 13.0–17.0)
MCH: 29.6 pg (ref 26.0–34.0)
MCHC: 32.4 g/dL (ref 30.0–36.0)
MCV: 91.3 fL (ref 78.0–100.0)
Platelets: 43 10*3/uL — ABNORMAL LOW (ref 150–400)
RBC: 2.87 MIL/uL — ABNORMAL LOW (ref 4.22–5.81)
RDW: 18.2 % — AB (ref 11.5–15.5)
WBC: 12.4 10*3/uL — ABNORMAL HIGH (ref 4.0–10.5)

## 2014-11-30 LAB — PREPARE PLATELET PHERESIS: UNIT DIVISION: 0

## 2014-11-30 SURGERY — EGD (ESOPHAGOGASTRODUODENOSCOPY)
Anesthesia: Monitor Anesthesia Care

## 2014-11-30 MED ORDER — PROPOFOL 10 MG/ML IV BOLUS
INTRAVENOUS | Status: DC | PRN
  Administered 2014-11-30: 25 mg via INTRAVENOUS

## 2014-11-30 MED ORDER — LACTATED RINGERS IV SOLN
INTRAVENOUS | Status: DC
Start: 2014-11-30 — End: 2014-11-30
  Administered 2014-11-30: 13:00:00 via INTRAVENOUS

## 2014-11-30 MED ORDER — LIDOCAINE HCL (CARDIAC) 20 MG/ML IV SOLN
INTRAVENOUS | Status: DC | PRN
  Administered 2014-11-30: 50 mg via INTRAVENOUS

## 2014-11-30 MED ORDER — LACTATED RINGERS IV SOLN
INTRAVENOUS | Status: DC | PRN
  Administered 2014-11-30: 14:00:00 via INTRAVENOUS

## 2014-11-30 MED ORDER — PROPOFOL 500 MG/50ML IV EMUL
INTRAVENOUS | Status: DC | PRN
  Administered 2014-11-30: 75 ug/kg/min via INTRAVENOUS

## 2014-11-30 MED ORDER — BUTAMBEN-TETRACAINE-BENZOCAINE 2-2-14 % EX AERO
INHALATION_SPRAY | CUTANEOUS | Status: DC | PRN
  Administered 2014-11-30: 1 via TOPICAL

## 2014-11-30 MED ORDER — SODIUM CHLORIDE 0.9 % IV SOLN: INTRAVENOUS | Status: DC

## 2014-11-30 NOTE — Anesthesia Preprocedure Evaluation (Signed)
Anesthesia Evaluation  Patient identified by MRN, date of birth, ID band Patient awake    Reviewed: Allergy & Precautions, NPO status , Patient's Chart, lab work & pertinent test results  Airway Mallampati: II  TM Distance: >3 FB Neck ROM: Full    Dental   Pulmonary sleep apnea ,    breath sounds clear to auscultation       Cardiovascular + CAD and + CABG  + Cardiac Defibrillator + Valvular Problems/Murmurs (s/p AVR. 2014 Echo EF 50%)  Rhythm:Regular Rate:Normal     Neuro/Psych negative neurological ROS     GI/Hepatic negative GI ROS, Neg liver ROS,   Endo/Other  negative endocrine ROS  Renal/GU Renal InsufficiencyRenal disease     Musculoskeletal   Abdominal   Peds  Hematology  (+) Blood dyscrasia, anemia , Thrombocytopenia- 43,000   Anesthesia Other Findings   Reproductive/Obstetrics                             Anesthesia Physical Anesthesia Plan  ASA: III  Anesthesia Plan: MAC   Post-op Pain Management:    Induction: Intravenous  Airway Management Planned: Natural Airway and Nasal Cannula  Additional Equipment:   Intra-op Plan:   Post-operative Plan:   Informed Consent: I have reviewed the patients History and Physical, chart, labs and discussed the procedure including the risks, benefits and alternatives for the proposed anesthesia with the patient or authorized representative who has indicated his/her understanding and acceptance.     Plan Discussed with: CRNA  Anesthesia Plan Comments:         Anesthesia Quick Evaluation

## 2014-11-30 NOTE — Anesthesia Postprocedure Evaluation (Signed)
  Anesthesia Post-op Note  Patient: Christopher Hood  Procedure(s) Performed: Procedure(s): ESOPHAGOGASTRODUODENOSCOPY (EGD) (N/A)  Patient Location: PACU  Anesthesia Type:MAC  Level of Consciousness: awake and alert   Airway and Oxygen Therapy: Patient Spontanous Breathing  Post-op Pain: none  Post-op Assessment: Post-op Vital signs reviewed              Post-op Vital Signs: Reviewed  Last Vitals:  Filed Vitals:   11/30/14 1440  BP: 125/61  Pulse: 84  Temp:   Resp: 18    Complications: No apparent anesthesia complications

## 2014-11-30 NOTE — Transfer of Care (Signed)
Immediate Anesthesia Transfer of Care Note  Patient: Christopher Hood  Procedure(s) Performed: Procedure(s): ESOPHAGOGASTRODUODENOSCOPY (EGD) (N/A)  Patient Location: PACU and Endoscopy Unit  Anesthesia Type:MAC  Level of Consciousness: awake, alert  and oriented  Airway & Oxygen Therapy: Patient Spontanous Breathing and Patient connected to nasal cannula oxygen  Post-op Assessment: Report given to RN and Post -op Vital signs reviewed and stable  Post vital signs: Reviewed and stable  Last Vitals:  Filed Vitals:   11/30/14 1439  BP: 125/61  Pulse: 82  Temp: 37.6 C  Resp: 18    Complications: No apparent anesthesia complications

## 2014-11-30 NOTE — Op Note (Signed)
[facility] [place], [id number]   ENDOSCOPY PROCEDURE REPORT  PATIENT: Christopher Hood, Christopher Hood  MR#: [id number] BIRTHDATE: 1940-06-15 , 74  yrs. old GENDER: male ENDOSCOPIST: Jerene Bears, MD REFERRED BY:   [facility] PROCEDURE DATE:  11/30/2014 PROCEDURE:  EGD, diagnostic ASA CLASS:     Class III INDICATIONS:  anemia requiring transfusion, heme + stools, RUQ pain.  MEDICATIONS: Monitored anesthesia care and Per Anesthesia TOPICAL ANESTHETIC: Cetacaine Spray  DESCRIPTION OF PROCEDURE: After the risks benefits and alternatives of the procedure were thoroughly explained, informed consent was obtained.  The Pentax Gastroscope [id number] endoscope was introduced through the mouth and advanced to the second portion of the duodenum , Without limitations.  The instrument was slowly withdrawn as the mucosa was fully examined.   ESOPHAGUS: Mild esophagitis was found in the mid esophagus and distal esophagus.  There was mild sloughing of mucosa but no evidence of ulceration. This is likely related to reflux esophagitis. No obvious evidence for Barrett's esophagus  STOMACH: Severe hemorrhagic gastritis (inflammation) was found in the entire examined stomach.  There was mucosal edema and oozing. There were several sessile gastric polyps in the gastric body and near the anastomosis, 2 of which were friable and oozing actively. There were also areas of gastric atrophy.  This is suspicious for bile gastritis.  A Billroth II anastomosis was found.  The site was intact and widely patent.  JEJUNUM: The exam showed no abnormalities in the jejunum.  Retroflexed views revealed no other abnormalities.  The scope was then withdrawn from the patient and the procedure completed.  COMPLICATIONS: There were no immediate complications.   ENDOSCOPIC IMPRESSION: 1.   Esophagitis in the mid esophagus and distal esophagus 2.   Severe gastritis  (inflammation) was found in the entire examined stomach with mucosal oozing 3.   Multiple gastric polyps, 2 of which are actively oozing. Biopsies not performed due to thrombocytopenia and risk of bleeding 3.   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   The exam showed no abnormalities in the jejunum  RECOMMENDATIONS: 1.  Begin liquid sucralfate 1 gram 3 times a day before meals and at bedtime 2.  Twice a day PPI at prescription doses, switch to either liquid PPI or capsules which can be opened and the granules sprinkled on applesauce or pudding to aid absorption given prior gastric surgery and gastrojejunal anastomosis 3.  Maintain platelets as high as possible to reduce bleeding risk 4.  If patient improves and platelets return to greater than 50K consider repeat endoscopy for biopsy both of gastric mucosa and gastric polyps  eSigned:  Jerene Bears, MD 11/30/2014 2:57 PM    CC: the patient  PATIENT NAME:  Christopher Hood, Christopher Hood MR#: [id number]

## 2014-12-01 ENCOUNTER — Encounter (HOSPITAL_COMMUNITY): Payer: Self-pay | Admitting: Internal Medicine

## 2014-12-01 ENCOUNTER — Other Ambulatory Visit (HOSPITAL_COMMUNITY): Payer: Medicare HMO

## 2014-12-01 DIAGNOSIS — R1011 Right upper quadrant pain: Secondary | ICD-10-CM | POA: Insufficient documentation

## 2014-12-01 DIAGNOSIS — K2970 Gastritis, unspecified, without bleeding: Secondary | ICD-10-CM | POA: Insufficient documentation

## 2014-12-01 DIAGNOSIS — R945 Abnormal results of liver function studies: Secondary | ICD-10-CM

## 2014-12-01 DIAGNOSIS — R7989 Other specified abnormal findings of blood chemistry: Secondary | ICD-10-CM

## 2014-12-01 LAB — CBC WITH DIFFERENTIAL/PLATELET
BAND NEUTROPHILS: 0 %
BLASTS: 0 %
Basophils Absolute: 0 10*3/uL (ref 0.0–0.1)
Basophils Relative: 0 %
EOS PCT: 0 %
Eosinophils Absolute: 0 10*3/uL (ref 0.0–0.7)
HEMATOCRIT: 21.8 % — AB (ref 39.0–52.0)
HEMOGLOBIN: 7.4 g/dL — AB (ref 13.0–17.0)
LYMPHS ABS: 0.1 10*3/uL — AB (ref 0.7–4.0)
LYMPHS PCT: 1 %
MCH: 30.6 pg (ref 26.0–34.0)
MCHC: 33.9 g/dL (ref 30.0–36.0)
MCV: 90.1 fL (ref 78.0–100.0)
MONO ABS: 0 10*3/uL — AB (ref 0.1–1.0)
MONOS PCT: 0 %
Metamyelocytes Relative: 0 %
Myelocytes: 0 %
NEUTROS ABS: 6.9 10*3/uL (ref 1.7–7.7)
Neutrophils Relative %: 99 %
Platelets: 35 10*3/uL — ABNORMAL LOW (ref 150–400)
Promyelocytes Absolute: 0 %
RBC: 2.42 MIL/uL — AB (ref 4.22–5.81)
RDW: 18 % — ABNORMAL HIGH (ref 11.5–15.5)
Smear Review: DECREASED
WBC: 7 10*3/uL (ref 4.0–10.5)
nRBC: 0 /100 WBC

## 2014-12-01 LAB — PREPARE PLATELET PHERESIS
Unit division: 0
Unit division: 0

## 2014-12-01 LAB — BASIC METABOLIC PANEL
ANION GAP: 9 (ref 5–15)
BUN: 43 mg/dL — ABNORMAL HIGH (ref 6–20)
CALCIUM: 8.4 mg/dL — AB (ref 8.9–10.3)
CO2: 21 mmol/L — ABNORMAL LOW (ref 22–32)
Chloride: 103 mmol/L (ref 101–111)
Creatinine, Ser: 1.39 mg/dL — ABNORMAL HIGH (ref 0.61–1.24)
GFR calc Af Amer: 56 mL/min — ABNORMAL LOW (ref >60)
GFR, EST NON AFRICAN AMERICAN: 48 mL/min — AB (ref >60)
Glucose, Bld: 141 mg/dL — ABNORMAL HIGH (ref 65–99)
POTASSIUM: 4.8 mmol/L (ref 3.5–5.1)
SODIUM: 133 mmol/L — AB (ref 135–145)

## 2014-12-01 LAB — URINALYSIS, ROUTINE W REFLEX MICROSCOPIC
Bilirubin Urine: NEGATIVE
GLUCOSE, UA: NEGATIVE mg/dL
Hgb urine dipstick: NEGATIVE
Ketones, ur: NEGATIVE mg/dL
LEUKOCYTES UA: NEGATIVE
Nitrite: NEGATIVE
PH: 6 (ref 5.0–8.0)
PROTEIN: NEGATIVE mg/dL
SPECIFIC GRAVITY, URINE: 1.017 (ref 1.005–1.030)
Urobilinogen, UA: 2 mg/dL — ABNORMAL HIGH (ref 0.0–1.0)

## 2014-12-01 LAB — PHOSPHORUS: PHOSPHORUS: 3.8 mg/dL (ref 2.5–4.6)

## 2014-12-01 LAB — AMYLASE: Amylase: 54 U/L (ref 28–100)

## 2014-12-01 LAB — MAGNESIUM: Magnesium: 2.1 mg/dL (ref 1.7–2.4)

## 2014-12-01 NOTE — Progress Notes (Signed)
Daily Rounding Note  12/01/2014, 9:33 AM    SUBJECTIVE:       Still with right flank pain, radiates around to his back ribs.  No nausea.  No m\BM for at least 3 days.   OBJECTIVE:         Vital signs in last 24 hours:    Temp:  [97.7 F (36.5 C)-99.7 F (37.6 C)] 99.7 F (37.6 C) (11/03 1439) Pulse Rate:  [80-87] 82 (11/03 1500) Resp:  [16-25] 17 (11/03 1500) BP: (117-155)/(38-61) 128/38 mmHg (11/03 1500) SpO2:  [94 %-99 %] 95 % (11/03 1500)   There were no vitals filed for this visit.  Looks ill.  ABD:  Tender without g/r on RUQ.  Active BS.    Intake/Output from previous day: 11/03 0701 - 11/04 0700 In: 200 [I.V.:200] Out: -   Intake/Output this shift:    Lab Results:  Recent Labs  11/29/14 0650 11/29/14 2210 11/30/14 1110  WBC 13.0* 13.0* 12.4*  HGB 6.9* 7.7* 8.5*  HCT 20.5* 23.3* 26.2*  PLT 30* 31* 43*   BMET  Recent Labs  11/29/14 1209  NA 132*  K 5.1  CL 100*  CO2 21*  GLUCOSE 110*  BUN 57*  CREATININE 2.02*  CALCIUM 8.4*   LFT  Recent Labs  11/29/14 1209 11/30/14 1110  PROT  --  7.2  ALBUMIN 2.2* 2.4*  AST  --  31  ALT  --  29  ALKPHOS  --  157*  BILITOT  --  4.7*  BILIDIR  --  2.0*  IBILI  --  2.7*   PT/INR No results for input(s): LABPROT, INR in the last 72 hours. Hepatitis Panel No results for input(s): HEPBSAG, HCVAB, HEPAIGM, HEPBIGM in the last 72 hours.  Studies/Results: Ct Abdomen Pelvis Wo Contrast  11/29/2014  CLINICAL DATA:  Right-sided abdominal pain. Abdominal aortic aneurysm. EXAM: CT ABDOMEN AND PELVIS WITHOUT CONTRAST TECHNIQUE: Multidetector CT imaging of the abdomen and pelvis was performed following the standard protocol without IV contrast. COMPARISON:  09/18/2014 FINDINGS: Lower chest: Stable cardiomegaly. Distal esophageal wall thickening again noted which is unchanged and suspicious for esophagitis. Hepatobiliary: No mass visualized on this  un-enhanced exam. Gallbladder is unremarkable. Pancreas: No mass or inflammatory process identified on this un-enhanced exam. Spleen: Within normal limits in size. Adrenals/Urinary Tract: Tiny nonobstructive 2 mm calculus seen in lower pole of left kidney. No evidence hydronephrosis or ureteral calculi. No definite mass visualized on this un-enhanced exam. Stomach/Bowel: Gastrojejunostomy is unremarkable in appearance on today's study. No evidence of obstruction, inflammatory process, or abnormal fluid collections. Previous right colectomy again demonstrated. Increased stool burden seen throughout the colon. Vascular/Lymphatic: No pathologically enlarged lymph nodes. 4.6 cm infrarenal abdominal aortic aneurysm shows no significant change in size. No evidence of aneurysm leak or rupture. Reproductive: No mass or other significant abnormality. Other: Small bilateral inguinal hernias again seen containing only fat. Musculoskeletal:  No suspicious bone lesions identified. IMPRESSION: Stable distal esophageal wall thickening, suspicious for esophagitis. Consider endoscopy for further evaluation. Stable 4.6 cm infrarenal abdominal aortic aneurysm. No evidence of aneurysm rupture. Recommend followup by abdomen and pelvis CTA in 6 months, and vascular surgery referral/consultation if not already obtained. This recommendation follows ACR consensus guidelines: White Paper of the ACR Incidental Findings Committee II on Vascular Findings. J Am Coll Radiol 2013; 10:789-794. Nonobstructive left nephrolithiasis. Electronically Signed   By: Earle Gell M.D.   On: 11/29/2014 15:00   US  Abdomen Limited  11/30/2014  CLINICAL DATA:  74 year old with 1 week history of right upper quadrant abdominal pain. EXAM: US ABDOMEN LIMITED - RIGHT UPPER QUADRANT COMPARISON:  CT abdomen and pelvis yesterday, 09/18/2014, 05/20/2014. FINDINGS: Gallbladder: No shadowing gallstones or echogenic sludge. No gallbladder wall thickening or  pericholecystic fluid. Negative sonographic Murphy sign according to the ultrasound technologist. Common bile duct: Diameter: Approximately 4 mm. Liver: Normal size and echotexture without focal parenchymal abnormality. Patent portal vein with hepatopetal flow. IMPRESSION: Normal examination. Electronically Signed   By: Evangeline Dakin M.D.   On: 11/30/2014 22:57   Dg Chest Port 1 View  11/30/2014  CLINICAL DATA:  Right-sided chest discomfort, pneumonia, permanent pacemaker, history of CABG. EXAM: PORTABLE CHEST 1 VIEW COMPARISON:  Portable chest x-ray of November 06, 2014 FINDINGS: The lungs are adequately inflated. There are stable coarse lung markings at both bases. The cardiac silhouette is top-normal in size. The pulmonary vascularity is normal. The permanent pacemaker defibrillator is in stable position. There are post CABG changes. The bony thorax exhibits no acute abnormality. IMPRESSION: Stable appearance of the chest since the previous study there is no acute cardiopulmonary abnormality. Electronically Signed   By: David  Martinique M.D.   On: 11/30/2014 07:18    ASSESMENT:   *  Dark, FOBT + stools.  EGD 11/4: Esophagitis, severe bile gastritis with oozing and gastric polyps. widely patent bilroth 2 anastomosis. No biopsy due to thrombocytopenia and bleeding risk.  No stools for at least 3 days.    *  Right flank pain, RUQ tenderness.  Worsens with movement which may reflect musculoskeletal component.  LFTs elevated.   Non-contrast CT and ultrasound unrevealing.  Dr Hilarie Fredrickson ordered HIDA scan to assure not havint GB/biliary issues.   *  MDS/aplastic anemia.  Requiring aggressive transfusion support for thrombocytopenia and anemia.   *  S/p removal of infected porta cath, healing open upper right chest wound.    PLAN   *  HIDA scan ordered. As he had narcotics and ate today, nuc med will do this in AM.    *  LFTs in AM.    Azucena Freed  12/01/2014, 9:33 AM Pager: [phone]

## 2014-12-02 ENCOUNTER — Other Ambulatory Visit (HOSPITAL_COMMUNITY): Payer: Medicare HMO

## 2014-12-02 DIAGNOSIS — K2971 Gastritis, unspecified, with bleeding: Secondary | ICD-10-CM | POA: Insufficient documentation

## 2014-12-02 LAB — RENAL FUNCTION PANEL
ANION GAP: 8 (ref 5–15)
Albumin: 2 g/dL — ABNORMAL LOW (ref 3.5–5.0)
BUN: 32 mg/dL — ABNORMAL HIGH (ref 6–20)
CHLORIDE: 102 mmol/L (ref 101–111)
CO2: 23 mmol/L (ref 22–32)
Calcium: 8.2 mg/dL — ABNORMAL LOW (ref 8.9–10.3)
Creatinine, Ser: 1.13 mg/dL (ref 0.61–1.24)
Glucose, Bld: 107 mg/dL — ABNORMAL HIGH (ref 65–99)
POTASSIUM: 4.2 mmol/L (ref 3.5–5.1)
Phosphorus: 4 mg/dL (ref 2.5–4.6)
Sodium: 133 mmol/L — ABNORMAL LOW (ref 135–145)

## 2014-12-02 LAB — HEPATIC FUNCTION PANEL
ALBUMIN: 2.1 g/dL — AB (ref 3.5–5.0)
ALK PHOS: 153 U/L — AB (ref 38–126)
ALT: 30 U/L (ref 17–63)
AST: 26 U/L (ref 15–41)
BILIRUBIN TOTAL: 2 mg/dL — AB (ref 0.3–1.2)
Bilirubin, Direct: 1.1 mg/dL — ABNORMAL HIGH (ref 0.1–0.5)
Indirect Bilirubin: 0.9 mg/dL (ref 0.3–0.9)
TOTAL PROTEIN: 6.2 g/dL — AB (ref 6.5–8.1)

## 2014-12-02 LAB — CBC WITH DIFFERENTIAL/PLATELET
BAND NEUTROPHILS: 0 %
BASOS PCT: 0 %
Basophils Absolute: 0 10*3/uL (ref 0.0–0.1)
Blasts: 0 %
EOS ABS: 0 10*3/uL (ref 0.0–0.7)
EOS PCT: 0 %
HCT: 22.2 % — ABNORMAL LOW (ref 39.0–52.0)
Hemoglobin: 7.2 g/dL — ABNORMAL LOW (ref 13.0–17.0)
LYMPHS ABS: 0.3 10*3/uL — AB (ref 0.7–4.0)
Lymphocytes Relative: 4 %
MCH: 29.8 pg (ref 26.0–34.0)
MCHC: 32.4 g/dL (ref 30.0–36.0)
MCV: 91.7 fL (ref 78.0–100.0)
METAMYELOCYTES PCT: 0 %
MONO ABS: 0.4 10*3/uL (ref 0.1–1.0)
MONOS PCT: 5 %
MYELOCYTES: 0 %
Neutro Abs: 8 10*3/uL — ABNORMAL HIGH (ref 1.7–7.7)
Neutrophils Relative %: 91 %
PLATELETS: 26 10*3/uL — AB (ref 150–400)
Promyelocytes Absolute: 0 %
RBC: 2.42 MIL/uL — AB (ref 4.22–5.81)
RDW: 17.9 % — AB (ref 11.5–15.5)
WBC: 8.7 10*3/uL (ref 4.0–10.5)
nRBC: 0 /100 WBC

## 2014-12-02 MED ORDER — SODIUM PERTECHNETATE TC 99M INJECTION
10.0000 | Freq: Once | INTRAVENOUS | Status: AC | PRN
  Administered 2014-12-02: 10 via INTRAVENOUS

## 2014-12-02 NOTE — Progress Notes (Signed)
[facility] Progress Note  Subjective:   Hgb 7.2, plt 26K. Had BM last night--says abd feels better than it did. No N/V.  Had EGD 11/30/14:   ENDOSCOPIC IMPRESSION: 1. Esophagitis in the mid esophagus and distal esophagus 2. Severe gastritis (inflammation) was found in the entire examined stomach with mucosal oozing 3. Multiple gastric polyps, 2 of which are actively oozing. Biopsies not performed due to thrombocytopenia and risk of bleeding 3. 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 The exam showed no abnormalities in the jejunum RECOMMENDATIONS: 1. Begin liquid sucralfate 1 gram 3 times a day before meals and at bedtime 2. Twice a day PPI at prescription doses, switch to either liquid PPI or capsules which can be opened and the granules sprinkled on applesauce or pudding to aid absorption given prior gastric surgery and gastrojejunal anastomosis 3. Maintain platelets as high as possible to reduce bleeding risk 4. If patient improves HIDA today normal, no evidence of biliary obstruction.   Objective:  Vital signs in last 24 hours:     General:   Alert,chronically ill appearing male in NAD Abdomen:  Soft, mild right sided TTP with no rebound or guarding,Normal bowel sounds   Lab Results:  Recent Labs  11/30/14 1110 12/01/14 1445 12/02/14 0612  WBC 12.4* 7.0 8.7  HGB 8.5* 7.4* 7.2*  HCT 26.2* 21.8* 22.2*  PLT 43* 35* 26*   BMET  Recent Labs  11/29/14 1209 12/01/14 1130 12/02/14 0612  NA 132* 133* 133*  K 5.1 4.8 4.2  CL 100* 103 102  CO2 21* 21* 23  GLUCOSE 110* 141* 107*  BUN 57* 43* 32*  CREATININE 2.02* 1.39* 1.13  CALCIUM 8.4* 8.4* 8.2*   LFT  Recent Labs  12/02/14 0612  PROT 6.2*  ALBUMIN 2.1*  2.0*  AST 26  ALT 30  ALKPHOS 153*  BILITOT 2.0*  BILIDIR 1.1*  IBILI 0.9   PT/INR No results for input(s): LABPROT, INR in the last 72 hours. Hepatitis Panel No results for input(s): HEPBSAG, HCVAB, HEPAIGM,  HEPBIGM in the last 72 hours.  Dg Lumbar Spine 2-3 Views  12/01/2014  CLINICAL DATA:  74 year old male with chronic lumbar spine pain and left radiculopathy EXAM: LUMBAR SPINE - 2-3 VIEW COMPARISON:  Concurrently obtained abdominal radiographs FINDINGS: No evidence of acute fracture or malalignment. Defects of the superior endplates at L2, L3 and L5 are similar compared to prior. 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 Multi level facet arthropathy beginning at L2-L3 in extending through L5-S1. Atherosclerotic and aneurysmal abdominal aorta as seen on recent prior CT imaging. Large stool ball overlying the rectum consistent with the clinical history of fecal impaction. IMPRESSION: 1. 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 2. Multilevel lumbar facet arthropathy beginning at L2-L3 in extending through L5-S1. 3. Atherosclerotic and aneurysmal abdominal aorta as seen on recent prior CT imaging. 4. Rectal fecal impaction. Electronically Signed   By: Jacqulynn Cadet M.D.   On: 12/01/2014 14:15   Dg Abd 1 View  12/01/2014  CLINICAL DATA:  74 year old male with diffuse abdominal pain and colonic impaction EXAM: ABDOMEN - 1 VIEW COMPARISON:  CT scan abdomen/ pelvis 11/29/2014 FINDINGS: While the bowel gas pattern is not obstructed, there is a large stool ball overlying the rectum which measures 9.5 cm in diameter. Retained oral contrast material is noted throughout the colon. The bones appear diffusely demineralized. IMPRESSION: Rectal fecal impaction. Electronically Signed   By: Dellis Filbert.D.  On: 12/01/2014 14:09   Nm Hepatobiliary Including Gb  12/02/2014  CLINICAL DATA:  74 year old male with a history of right upper quadrant pain increasing LFTs EXAM: NUCLEAR MEDICINE HEPATOBILIARY IMAGING TECHNIQUE: Sequential images of the abdomen were obtained out to 60 minutes following intravenous administration of radiopharmaceutical. RADIOPHARMACEUTICALS:  Five mCi  Tc-52m  Choletec IV COMPARISON:  CT 11/29/2014, ultrasound 11/30/2014 FINDINGS: Planar imaging of the abdomen performed after administration of hepatobiliary radiotracer. Initial images demonstrate uniform uptake of the radiotracer throughout the liver parenchyma. There is accumulation within the biliary tree at 15 minutes, with appearance of the gallbladder at 15 minutes. Progression of radiotracer through the extrahepatic biliary system into the proximal small bowel. IMPRESSION: Unremarkable HIDA study, with no evidence of acute biliary obstruction. Signed, Dulcy Fanny. Earleen Newport, DO [facility] Electronically Signed   By: Corrie Mckusick D.O.   On: 12/02/2014 09:51   US Abdomen Limited  11/30/2014  CLINICAL DATA:  74 year old with 1 week history of right upper quadrant abdominal pain. EXAM: US ABDOMEN LIMITED - RIGHT UPPER QUADRANT COMPARISON:  CT abdomen and pelvis yesterday, 09/18/2014, 05/20/2014. FINDINGS: Gallbladder: No shadowing gallstones or echogenic sludge. No gallbladder wall thickening or pericholecystic fluid. Negative sonographic Murphy sign according to the ultrasound technologist. Common bile duct: Diameter: Approximately 4 mm. Liver: Normal size and echotexture without focal parenchymal abnormality. Patent portal vein with hepatopetal flow. IMPRESSION: Normal examination. Electronically Signed   By: Evangeline Dakin M.D.   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 Had EGD with esophagitis, severe bile gastritis with oozing and gastric polyps. Bilroth 2 anastomosis patent. Had right flank/abd pain--HIDA today nl and LFTs improving. Had BM last night which he says decreased pain considerably. Recommend transfusing to keep Hgb above 8 and platelets closer to 50. Trend LFTs.      Christopher Hood, Christopher Hood 12/02/2014, Pager [phone] Mon-Fri 8a-5p [phone] after 5p,  weekends, holidays

## 2014-12-03 LAB — RENAL FUNCTION PANEL
ALBUMIN: 2 g/dL — AB (ref 3.5–5.0)
Anion gap: 9 (ref 5–15)
BUN: 20 mg/dL (ref 6–20)
CHLORIDE: 103 mmol/L (ref 101–111)
CO2: 23 mmol/L (ref 22–32)
CREATININE: 0.85 mg/dL (ref 0.61–1.24)
Calcium: 8.3 mg/dL — ABNORMAL LOW (ref 8.9–10.3)
GFR calc Af Amer: >60 mL/min (ref >60)
Glucose, Bld: 102 mg/dL — ABNORMAL HIGH (ref 65–99)
PHOSPHORUS: 3.8 mg/dL (ref 2.5–4.6)
Potassium: 4 mmol/L (ref 3.5–5.1)
SODIUM: 135 mmol/L (ref 135–145)

## 2014-12-03 LAB — PREPARE RBC (CROSSMATCH)

## 2014-12-04 LAB — CBC WITH DIFFERENTIAL/PLATELET
BAND NEUTROPHILS: 17 %
BASOS PCT: 0 %
Basophils Absolute: 0 10*3/uL (ref 0.0–0.1)
Blasts: 0 %
EOS ABS: 0 10*3/uL (ref 0.0–0.7)
EOS PCT: 0 %
HCT: 21.3 % — ABNORMAL LOW (ref 39.0–52.0)
Hemoglobin: 6.8 g/dL — CL (ref 13.0–17.0)
LYMPHS ABS: 0.3 10*3/uL — AB (ref 0.7–4.0)
Lymphocytes Relative: 5 %
MCH: 29.4 pg (ref 26.0–34.0)
MCHC: 31.9 g/dL (ref 30.0–36.0)
MCV: 92.2 fL (ref 78.0–100.0)
MONO ABS: 0.1 10*3/uL (ref 0.1–1.0)
Metamyelocytes Relative: 0 %
Monocytes Relative: 2 %
Myelocytes: 0 %
NEUTROS ABS: 5.4 10*3/uL (ref 1.7–7.7)
NRBC: 0 /100{WBCs}
Neutrophils Relative %: 76 %
OTHER: 0 %
PLATELETS: 23 10*3/uL — AB (ref 150–400)
Promyelocytes Absolute: 0 %
RBC: 2.31 MIL/uL — ABNORMAL LOW (ref 4.22–5.81)
RDW: 18 % — ABNORMAL HIGH (ref 11.5–15.5)
WBC: 5.8 10*3/uL (ref 4.0–10.5)

## 2014-12-04 LAB — TYPE AND SCREEN
ABO/RH(D): A POS
ANTIBODY SCREEN: NEGATIVE
Unit division: 0
Unit division: 0

## 2014-12-04 LAB — URINE CULTURE

## 2014-12-04 LAB — RENAL FUNCTION PANEL
Albumin: 2 g/dL — ABNORMAL LOW (ref 3.5–5.0)
Anion gap: 7 (ref 5–15)
BUN: 17 mg/dL (ref 6–20)
CALCIUM: 8.3 mg/dL — AB (ref 8.9–10.3)
CO2: 22 mmol/L (ref 22–32)
CREATININE: 0.75 mg/dL (ref 0.61–1.24)
Chloride: 105 mmol/L (ref 101–111)
GFR calc Af Amer: >60 mL/min (ref >60)
GFR calc non Af Amer: >60 mL/min (ref >60)
GLUCOSE: 98 mg/dL (ref 65–99)
Phosphorus: 3.7 mg/dL (ref 2.5–4.6)
Potassium: 3.9 mmol/L (ref 3.5–5.1)
SODIUM: 134 mmol/L — AB (ref 135–145)

## 2014-12-04 LAB — CBC
HCT: 24.5 % — ABNORMAL LOW (ref 39.0–52.0)
Hemoglobin: 8 g/dL — ABNORMAL LOW (ref 13.0–17.0)
MCH: 29.6 pg (ref 26.0–34.0)
MCHC: 32.7 g/dL (ref 30.0–36.0)
MCV: 90.7 fL (ref 78.0–100.0)
PLATELETS: 19 10*3/uL — AB (ref 150–400)
RBC: 2.7 MIL/uL — ABNORMAL LOW (ref 4.22–5.81)
RDW: 18 % — AB (ref 11.5–15.5)
WBC: 6 10*3/uL (ref 4.0–10.5)

## 2014-12-05 LAB — RENAL FUNCTION PANEL
Albumin: 2.1 g/dL — ABNORMAL LOW (ref 3.5–5.0)
Anion gap: 10 (ref 5–15)
BUN: 14 mg/dL (ref 6–20)
CO2: 23 mmol/L (ref 22–32)
Calcium: 8.4 mg/dL — ABNORMAL LOW (ref 8.9–10.3)
Chloride: 102 mmol/L (ref 101–111)
Creatinine, Ser: 0.73 mg/dL (ref 0.61–1.24)
GFR calc Af Amer: >60 mL/min (ref >60)
GFR calc non Af Amer: >60 mL/min (ref >60)
Glucose, Bld: 91 mg/dL (ref 65–99)
Phosphorus: 3.7 mg/dL (ref 2.5–4.6)
Potassium: 3.9 mmol/L (ref 3.5–5.1)
Sodium: 135 mmol/L (ref 135–145)

## 2014-12-05 LAB — AMIODARONE LEVEL
AMIODARONE LVL: 0.5 ug/mL — AB (ref 1.0–2.5)
N-Desethyl-Amiodarone: 0.4 ug/mL — ABNORMAL LOW (ref 1.0–2.5)

## 2014-12-05 LAB — CBC WITH DIFFERENTIAL/PLATELET
EOS ABS: 0.1 10*3/uL (ref 0.0–0.7)
EOS PCT: 1 %
HCT: 24 % — ABNORMAL LOW (ref 39.0–52.0)
Hemoglobin: 8 g/dL — ABNORMAL LOW (ref 13.0–17.0)
LYMPHS PCT: 8 %
Lymphs Abs: 0.4 10*3/uL — ABNORMAL LOW (ref 0.7–4.0)
MCH: 30.5 pg (ref 26.0–34.0)
MCHC: 33.3 g/dL (ref 30.0–36.0)
MCV: 91.6 fL (ref 78.0–100.0)
MONO ABS: 0.1 10*3/uL (ref 0.1–1.0)
Monocytes Relative: 2 %
Neutro Abs: 4.9 10*3/uL (ref 1.7–7.7)
Neutrophils Relative %: 89 %
PLATELETS: 31 10*3/uL — AB (ref 150–400)
RBC: 2.62 MIL/uL — AB (ref 4.22–5.81)
RDW: 18 % — ABNORMAL HIGH (ref 11.5–15.5)
WBC: 5.5 10*3/uL (ref 4.0–10.5)

## 2014-12-05 LAB — PREPARE PLATELET PHERESIS: Unit division: 0

## 2014-12-05 LAB — PROTIME-INR
INR: 1.28 (ref 0.00–1.49)
Prothrombin Time: 16.1 seconds — ABNORMAL HIGH (ref 11.6–15.2)

## 2014-12-06 LAB — RENAL FUNCTION PANEL
ALBUMIN: 2.3 g/dL — AB (ref 3.5–5.0)
ANION GAP: 8 (ref 5–15)
BUN: 13 mg/dL (ref 6–20)
CALCIUM: 8.5 mg/dL — AB (ref 8.9–10.3)
CO2: 24 mmol/L (ref 22–32)
CREATININE: 0.67 mg/dL (ref 0.61–1.24)
Chloride: 105 mmol/L (ref 101–111)
GFR calc non Af Amer: >60 mL/min (ref >60)
Glucose, Bld: 96 mg/dL (ref 65–99)
PHOSPHORUS: 3.6 mg/dL (ref 2.5–4.6)
Potassium: 4 mmol/L (ref 3.5–5.1)
SODIUM: 137 mmol/L (ref 135–145)

## 2014-12-07 LAB — CBC WITH DIFFERENTIAL/PLATELET
BASOS PCT: 0 %
Band Neutrophils: 0 %
Basophils Absolute: 0 10*3/uL (ref 0.0–0.1)
Blasts: 0 %
EOS PCT: 1 %
Eosinophils Absolute: 0.1 10*3/uL (ref 0.0–0.7)
HCT: 24.5 % — ABNORMAL LOW (ref 39.0–52.0)
Hemoglobin: 7.8 g/dL — ABNORMAL LOW (ref 13.0–17.0)
LYMPHS ABS: 0.4 10*3/uL — AB (ref 0.7–4.0)
LYMPHS PCT: 7 %
MCH: 29.2 pg (ref 26.0–34.0)
MCHC: 31.8 g/dL (ref 30.0–36.0)
MCV: 91.8 fL (ref 78.0–100.0)
MONO ABS: 0.1 10*3/uL (ref 0.1–1.0)
MONOS PCT: 2 %
Metamyelocytes Relative: 0 %
Myelocytes: 0 %
NEUTROS PCT: 90 %
NRBC: 0 /100{WBCs}
Neutro Abs: 4.6 10*3/uL (ref 1.7–7.7)
Other: 0 %
PLATELETS: 23 10*3/uL — AB (ref 150–400)
Promyelocytes Absolute: 0 %
RBC: 2.67 MIL/uL — AB (ref 4.22–5.81)
RDW: 17.4 % — ABNORMAL HIGH (ref 11.5–15.5)
WBC: 5.2 10*3/uL (ref 4.0–10.5)

## 2014-12-07 LAB — RENAL FUNCTION PANEL
ANION GAP: 12 (ref 5–15)
Albumin: 2.3 g/dL — ABNORMAL LOW (ref 3.5–5.0)
BUN: 12 mg/dL (ref 6–20)
CALCIUM: 8.4 mg/dL — AB (ref 8.9–10.3)
CHLORIDE: 100 mmol/L — AB (ref 101–111)
CO2: 21 mmol/L — AB (ref 22–32)
CREATININE: 0.62 mg/dL (ref 0.61–1.24)
GFR calc Af Amer: >60 mL/min (ref >60)
GFR calc non Af Amer: >60 mL/min (ref >60)
GLUCOSE: 85 mg/dL (ref 65–99)
Phosphorus: 3.7 mg/dL (ref 2.5–4.6)
Potassium: 3.9 mmol/L (ref 3.5–5.1)
SODIUM: 133 mmol/L — AB (ref 135–145)

## 2014-12-07 LAB — MAGNESIUM: MAGNESIUM: 1.8 mg/dL (ref 1.7–2.4)

## 2014-12-08 LAB — CBC WITH DIFFERENTIAL/PLATELET
BASOS PCT: 0 %
Band Neutrophils: 5 %
Basophils Absolute: 0 10*3/uL (ref 0.0–0.1)
Blasts: 0 %
EOS PCT: 0 %
Eosinophils Absolute: 0 10*3/uL (ref 0.0–0.7)
HCT: 24.2 % — ABNORMAL LOW (ref 39.0–52.0)
Hemoglobin: 7.8 g/dL — ABNORMAL LOW (ref 13.0–17.0)
Lymphocytes Relative: 8 %
Lymphs Abs: 0.4 10*3/uL — ABNORMAL LOW (ref 0.7–4.0)
MCH: 29.8 pg (ref 26.0–34.0)
MCHC: 32.2 g/dL (ref 30.0–36.0)
MCV: 92.4 fL (ref 78.0–100.0)
MONO ABS: 0.1 10*3/uL (ref 0.1–1.0)
MYELOCYTES: 0 %
Metamyelocytes Relative: 0 %
Monocytes Relative: 1 %
NEUTROS ABS: 5.1 10*3/uL (ref 1.7–7.7)
NEUTROS PCT: 86 %
NRBC: 0 /100{WBCs}
Other: 0 %
PLATELETS: 22 10*3/uL — AB (ref 150–400)
Promyelocytes Absolute: 0 %
RBC: 2.62 MIL/uL — ABNORMAL LOW (ref 4.22–5.81)
RDW: 17.2 % — AB (ref 11.5–15.5)
WBC: 5.6 10*3/uL (ref 4.0–10.5)

## 2014-12-08 LAB — RENAL FUNCTION PANEL
Albumin: 2.6 g/dL — ABNORMAL LOW (ref 3.5–5.0)
Anion gap: 9 (ref 5–15)
BUN: 13 mg/dL (ref 6–20)
CHLORIDE: 103 mmol/L (ref 101–111)
CO2: 25 mmol/L (ref 22–32)
CREATININE: 0.64 mg/dL (ref 0.61–1.24)
Calcium: 8.6 mg/dL — ABNORMAL LOW (ref 8.9–10.3)
GFR calc Af Amer: >60 mL/min (ref >60)
GFR calc non Af Amer: >60 mL/min (ref >60)
Glucose, Bld: 93 mg/dL (ref 65–99)
Phosphorus: 3.7 mg/dL (ref 2.5–4.6)
Potassium: 4 mmol/L (ref 3.5–5.1)
Sodium: 137 mmol/L (ref 135–145)

## 2014-12-09 LAB — RENAL FUNCTION PANEL
ANION GAP: 6 (ref 5–15)
Albumin: 2.5 g/dL — ABNORMAL LOW (ref 3.5–5.0)
BUN: 14 mg/dL (ref 6–20)
CALCIUM: 8.5 mg/dL — AB (ref 8.9–10.3)
CO2: 27 mmol/L (ref 22–32)
Chloride: 102 mmol/L (ref 101–111)
Creatinine, Ser: 0.65 mg/dL (ref 0.61–1.24)
GFR calc non Af Amer: >60 mL/min (ref >60)
Glucose, Bld: 93 mg/dL (ref 65–99)
PHOSPHORUS: 3.7 mg/dL (ref 2.5–4.6)
POTASSIUM: 4 mmol/L (ref 3.5–5.1)
SODIUM: 135 mmol/L (ref 135–145)

## 2014-12-10 LAB — CBC WITH DIFFERENTIAL/PLATELET
BASOS ABS: 0 10*3/uL (ref 0.0–0.1)
BASOS PCT: 0 %
EOS ABS: 0 10*3/uL (ref 0.0–0.7)
Eosinophils Relative: 0 %
HEMATOCRIT: 22.6 % — AB (ref 39.0–52.0)
Hemoglobin: 7.4 g/dL — ABNORMAL LOW (ref 13.0–17.0)
Lymphocytes Relative: 21 %
Lymphs Abs: 0.5 10*3/uL — ABNORMAL LOW (ref 0.7–4.0)
MCH: 30 pg (ref 26.0–34.0)
MCHC: 32.7 g/dL (ref 30.0–36.0)
MCV: 91.5 fL (ref 78.0–100.0)
MONO ABS: 0.1 10*3/uL (ref 0.1–1.0)
MONOS PCT: 3 %
NEUTROS ABS: 1.8 10*3/uL (ref 1.7–7.7)
NEUTROS PCT: 75 %
Platelets: 17 10*3/uL — CL (ref 150–400)
RBC: 2.47 MIL/uL — ABNORMAL LOW (ref 4.22–5.81)
RDW: 17.1 % — AB (ref 11.5–15.5)
WBC: 2.3 10*3/uL — ABNORMAL LOW (ref 4.0–10.5)

## 2014-12-11 LAB — CBC WITH DIFFERENTIAL/PLATELET
BASOS PCT: 0 %
Band Neutrophils: 3 %
Basophils Absolute: 0 10*3/uL (ref 0.0–0.1)
Blasts: 0 %
EOS ABS: 0 10*3/uL (ref 0.0–0.7)
EOS PCT: 0 %
HCT: 23.8 % — ABNORMAL LOW (ref 39.0–52.0)
HEMOGLOBIN: 7.8 g/dL — AB (ref 13.0–17.0)
Lymphocytes Relative: 8 %
Lymphs Abs: 0.5 10*3/uL — ABNORMAL LOW (ref 0.7–4.0)
MCH: 30.1 pg (ref 26.0–34.0)
MCHC: 32.8 g/dL (ref 30.0–36.0)
MCV: 91.9 fL (ref 78.0–100.0)
METAMYELOCYTES PCT: 0 %
MONO ABS: 0.3 10*3/uL (ref 0.1–1.0)
MYELOCYTES: 0 %
Monocytes Relative: 4 %
NEUTROS PCT: 85 %
Neutro Abs: 5.7 10*3/uL (ref 1.7–7.7)
PLATELETS: 20 10*3/uL — AB (ref 150–400)
PROMYELOCYTES ABS: 0 %
RBC: 2.59 MIL/uL — AB (ref 4.22–5.81)
RDW: 17.3 % — ABNORMAL HIGH (ref 11.5–15.5)
WBC: 6.5 10*3/uL (ref 4.0–10.5)
nRBC: 0 /100 WBC

## 2014-12-11 LAB — BASIC METABOLIC PANEL
ANION GAP: 7 (ref 5–15)
BUN: 14 mg/dL (ref 6–20)
CHLORIDE: 103 mmol/L (ref 101–111)
CO2: 26 mmol/L (ref 22–32)
Calcium: 8.8 mg/dL — ABNORMAL LOW (ref 8.9–10.3)
Creatinine, Ser: 0.67 mg/dL (ref 0.61–1.24)
GFR calc Af Amer: >60 mL/min (ref >60)
Glucose, Bld: 94 mg/dL (ref 65–99)
POTASSIUM: 4 mmol/L (ref 3.5–5.1)
SODIUM: 136 mmol/L (ref 135–145)

## 2014-12-12 LAB — PREPARE PLATELET PHERESIS: Unit division: 0

## 2015-02-07 DIAGNOSIS — D4621 Refractory anemia with excess of blasts 1: Secondary | ICD-10-CM | POA: Diagnosis not present

## 2015-08-28 DEATH — deceased

## 2016-06-23 IMAGING — CT CT KNEE*L* W/CM
3 of 8 series · 9 of 33 positions shown, 11 images · IV contrast (agent unspecified)
Comparison: CT left knee 11/10/2014.

CLINICAL DATA: Open wounds on the left knee. Question abscess or
osteomyelitis. Subsequent encounter.

EXAM:
CT OF THE LEFT KNEE WITH CONTRAST
TECHNIQUE: Multidetector CT imaging was performed following the standard
protocol during bolus administration of intravenous contrast.

[Series 3: lfov ext 3.0 b40s · axial · 0.38mm/px · z∈[+718,+850]mm · 3 of 89 slices shown, 4 images]
[im 23/89  soft-tissue]
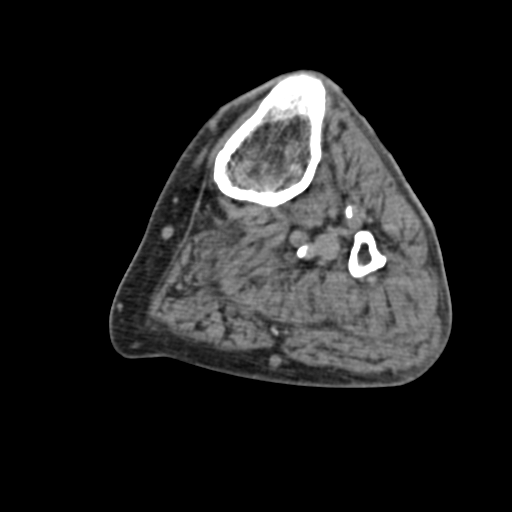
[im 23/89  bone]
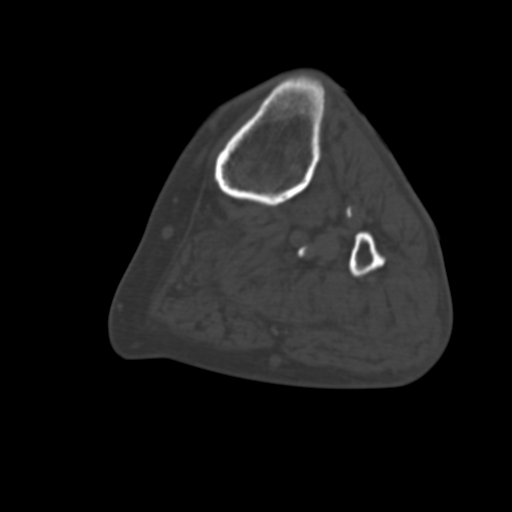
[im 45/89  bone]
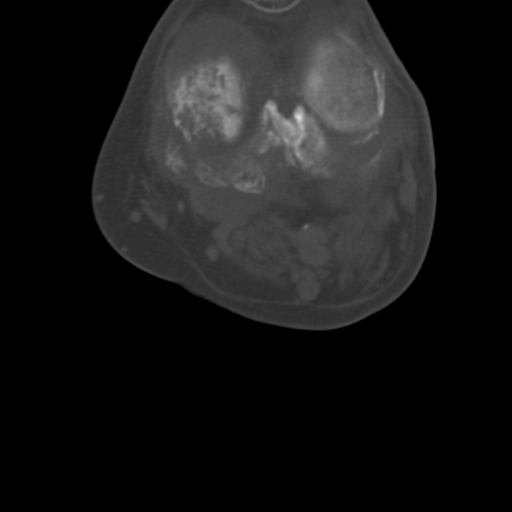
[im 67/89  bone]
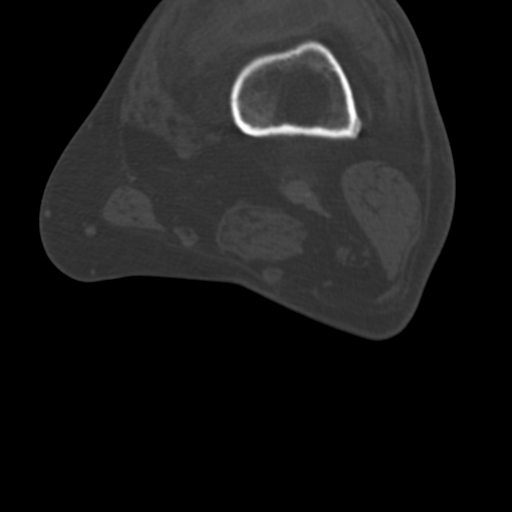

[Series 12: coronal bone · coronal · 0.39mm/px · 1 of 65 slices shown]
[im 33/65  bone]
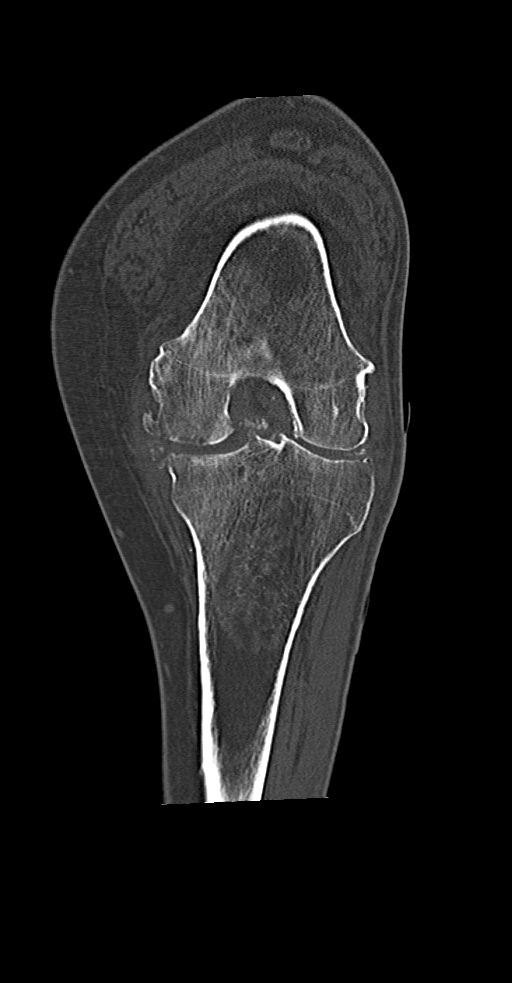

[Series 15: sagittalsoft tissue · sagittal · 0.51mm/px · 5 of 126 slices shown, 6 images]
[im 42/126  bone]
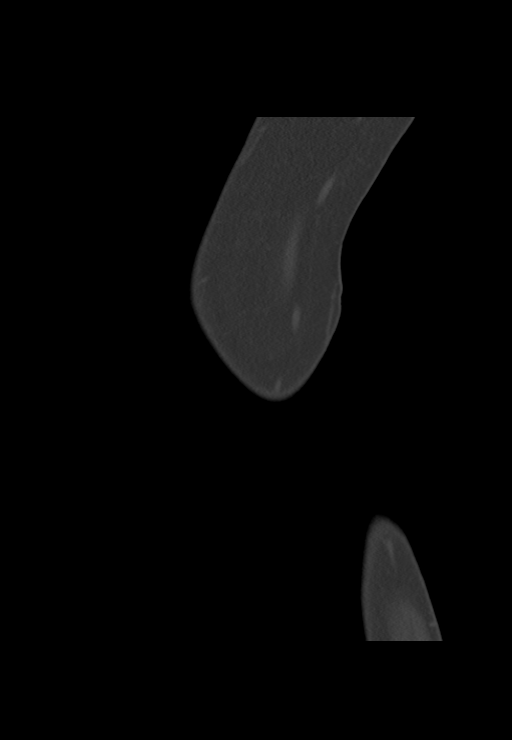
[im 53/126  bone]
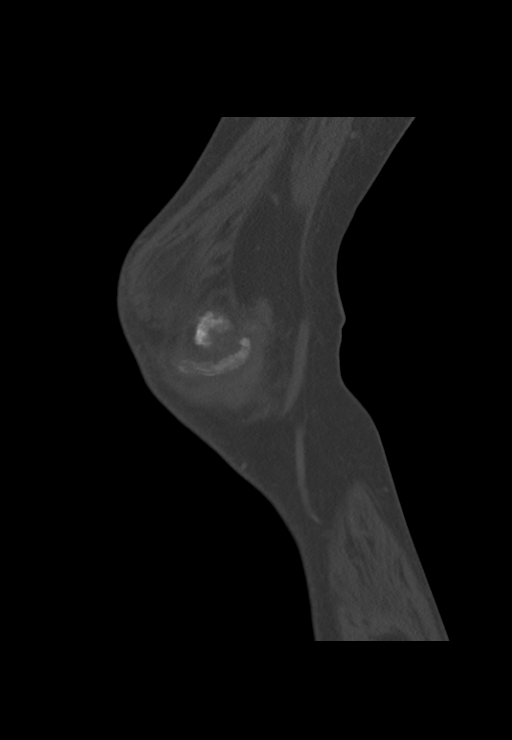
[im 63/126  soft-tissue]
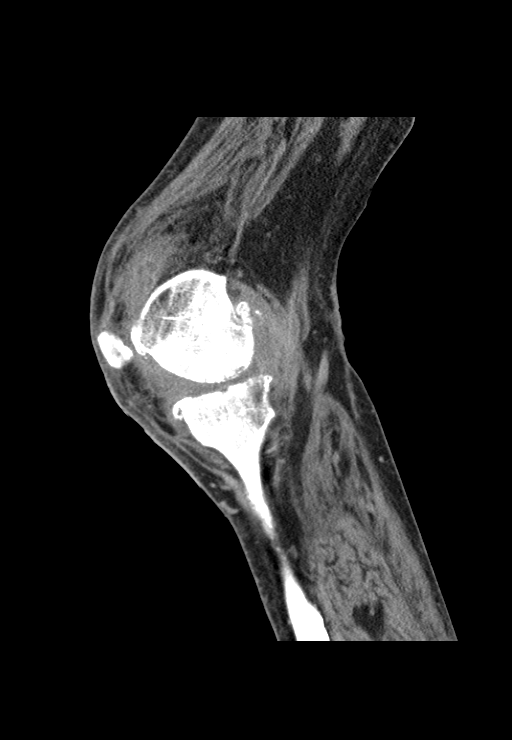
[im 63/126  bone]
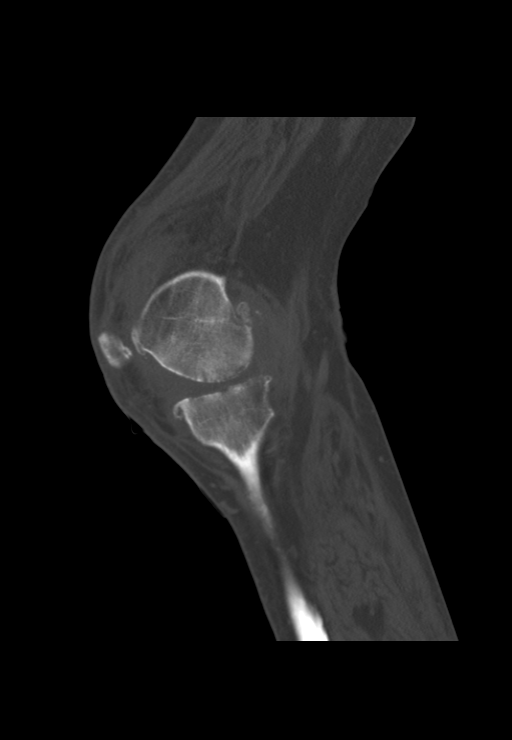
[im 73/126  bone]
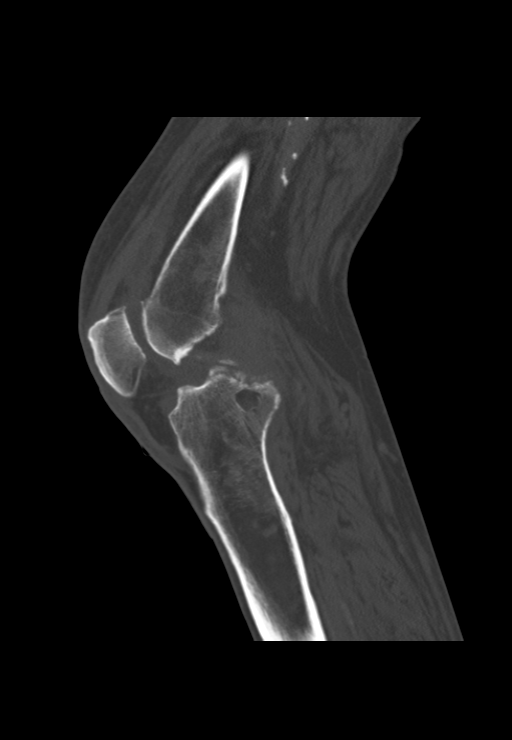
[im 84/126  bone]
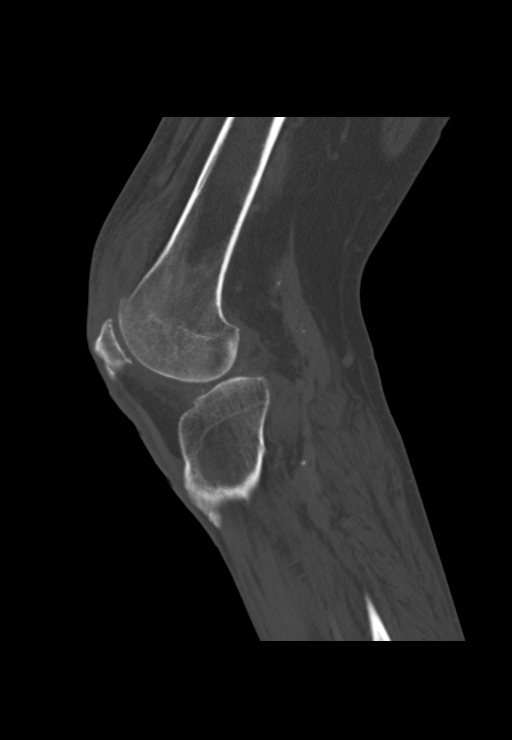

[9 of 33 positions shown; findings below may reference images not displayed]

FINDINGS: The patient has a small joint effusion which is decreased in size
since the prior exam. There is some synovial thickening about the
effusion which appears unchanged. No soft tissue abscess is seen. No
bony destructive change or periostitis is identified. Degenerative
disease about the knee is again seen. Chondrocalcinosis is noted.
Atherosclerotic vascular disease is identified.
IMPRESSION: Small joint effusion, decreased since the prior examination. No new
abnormality.

Osteoarthritis.

Chondrocalcinosis.

Atherosclerosis.

## 2016-07-03 IMAGING — CR DG CHEST 1V PORT
1 series · 1 of 1 positions shown · non-contrast
Comparison: Portable chest x-ray November 06, 2014

CLINICAL DATA: Right-sided chest discomfort, pneumonia, permanent
pacemaker, history of CABG.

EXAM:
PORTABLE CHEST 1 VIEW

[AP]
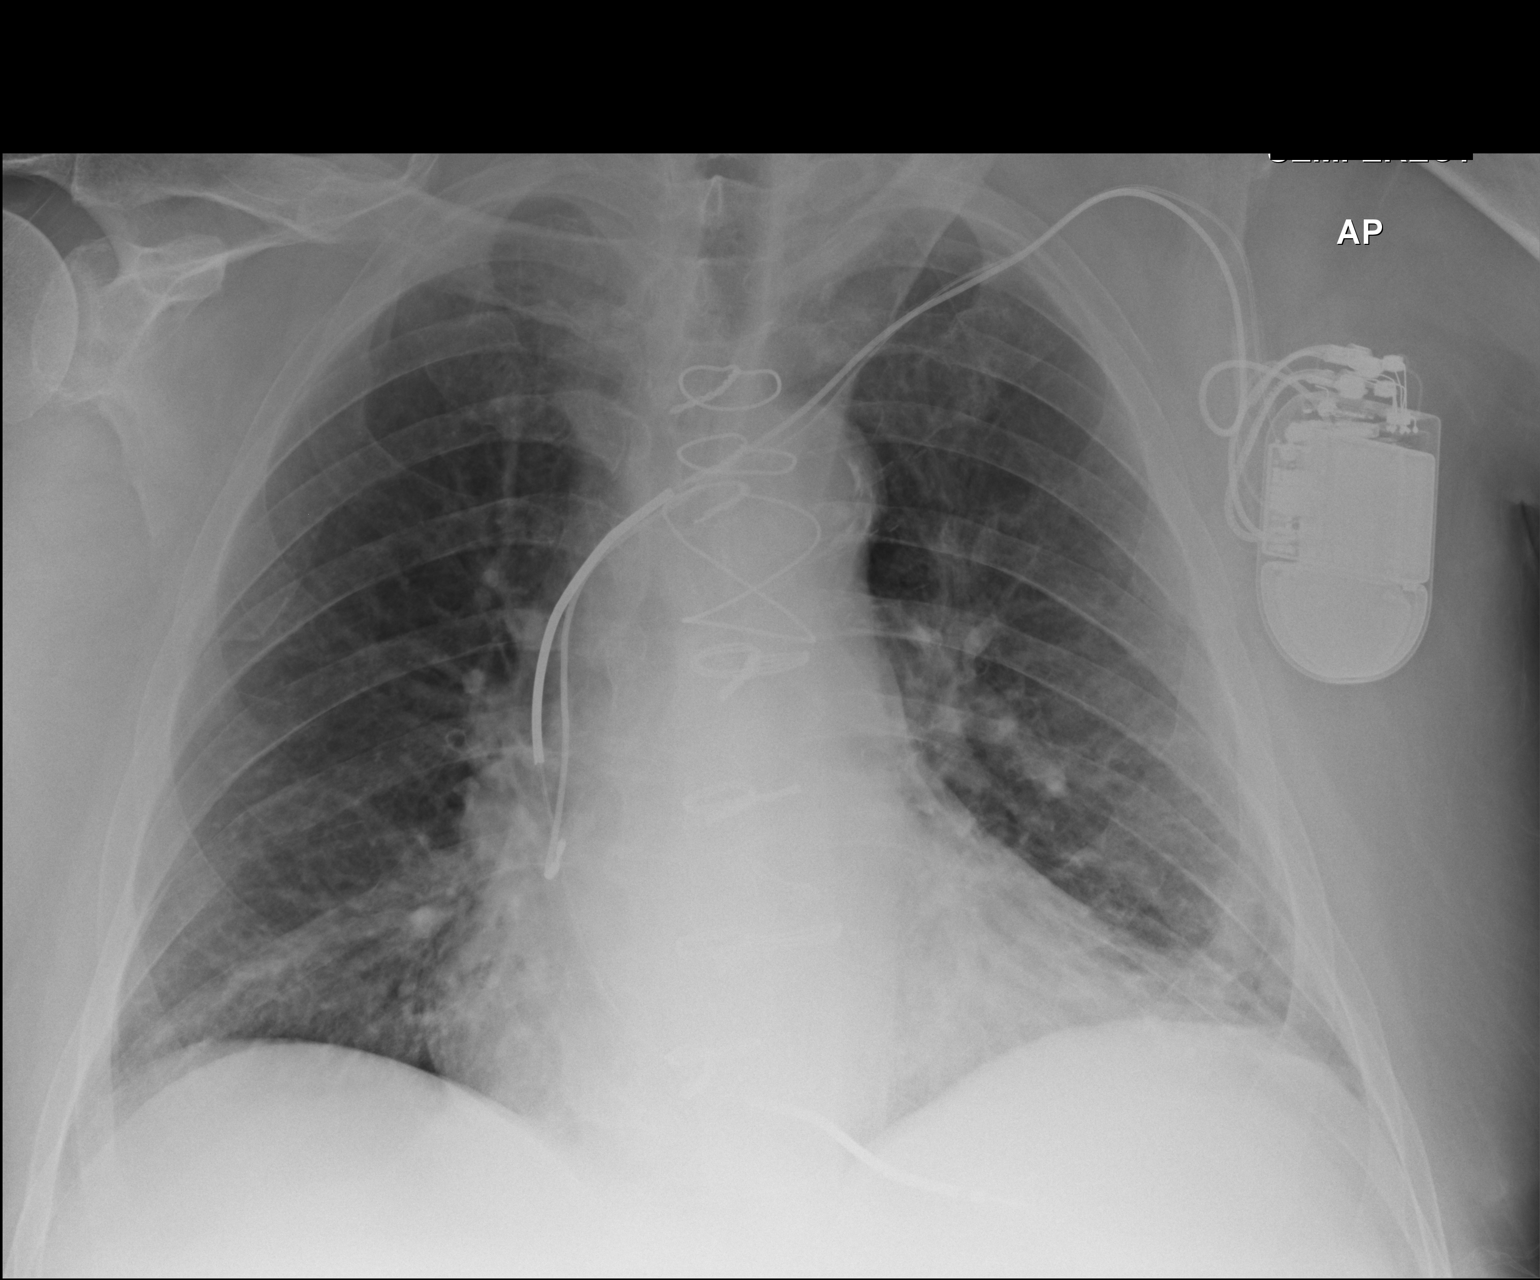

[1 of 1 positions shown; findings below may reference images not displayed]

FINDINGS: The lungs are adequately inflated. There are stable coarse lung
markings at both bases. The cardiac silhouette is top-normal in
size. The pulmonary vascularity is normal. The permanent pacemaker
defibrillator is in stable position. There are post CABG changes.
The bony thorax exhibits no acute abnormality.
IMPRESSION: Stable appearance of the chest since the previous study there is no
acute cardiopulmonary abnormality.

## 2017-01-08 IMAGING — US US RENAL
1 series · 14 of 22 positions shown · non-contrast
Comparison: Abdomen and pelvis CT dated 09/18/2014.

CLINICAL DATA: Acute kidney injury.

EXAM:
RENAL / URINARY TRACT ULTRASOUND COMPLETE

[Series 1: us renal · 0.30mm/px · 14 of 22 slices shown]
[im 1/22]
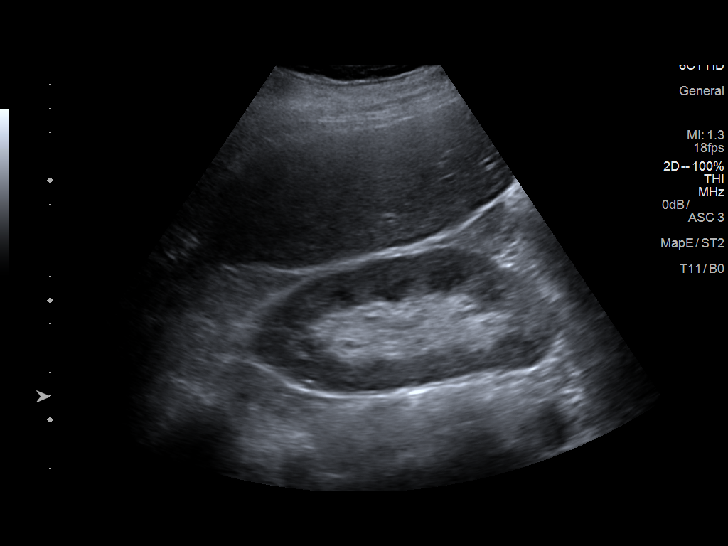
[im 3/22]
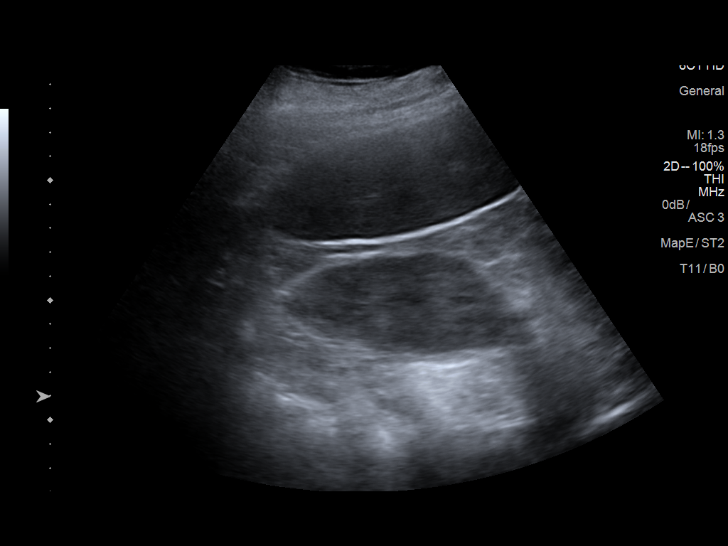
[im 4/22]
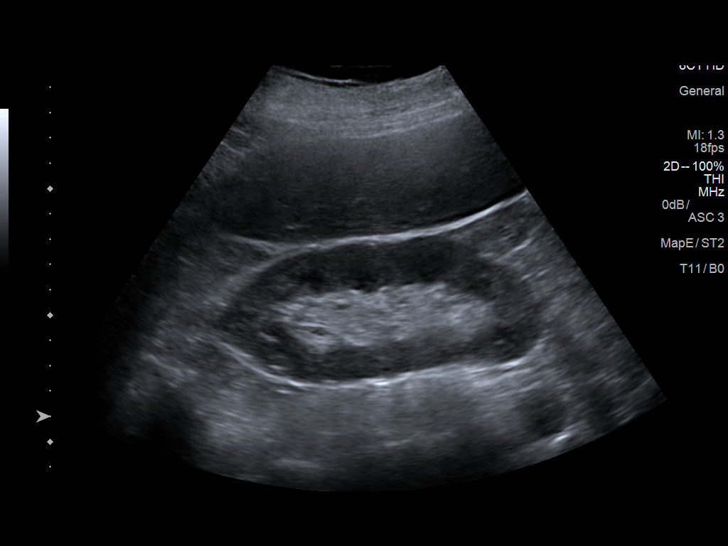
[im 6/22]
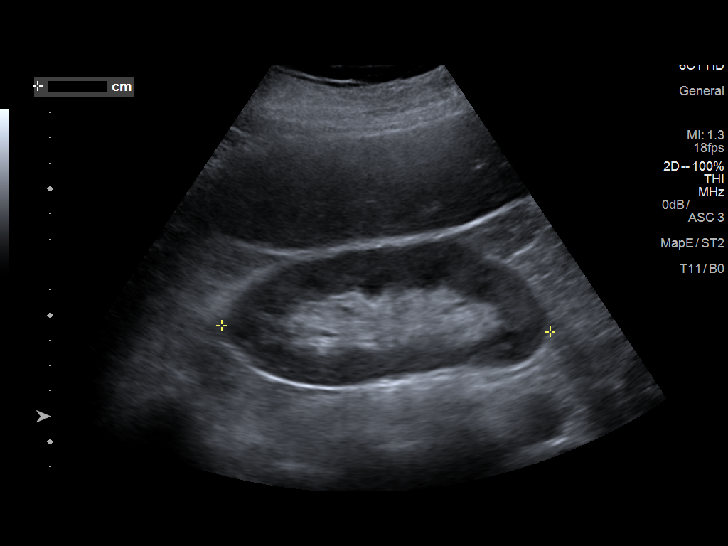
[im 8/22]
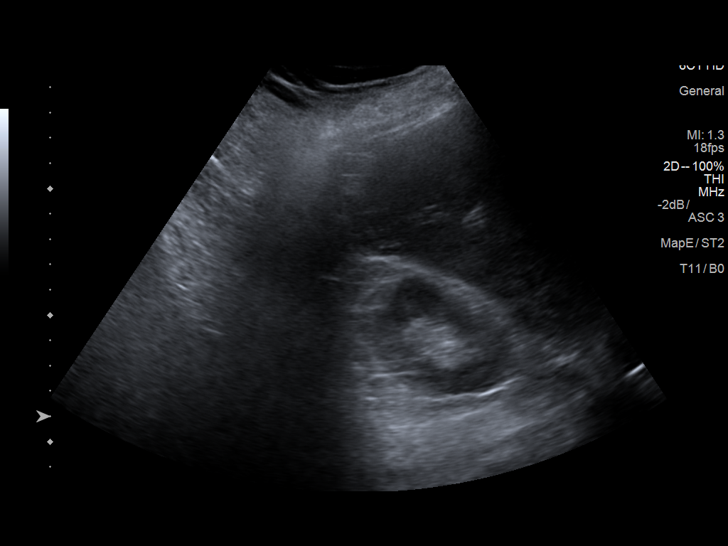
[im 9/22]
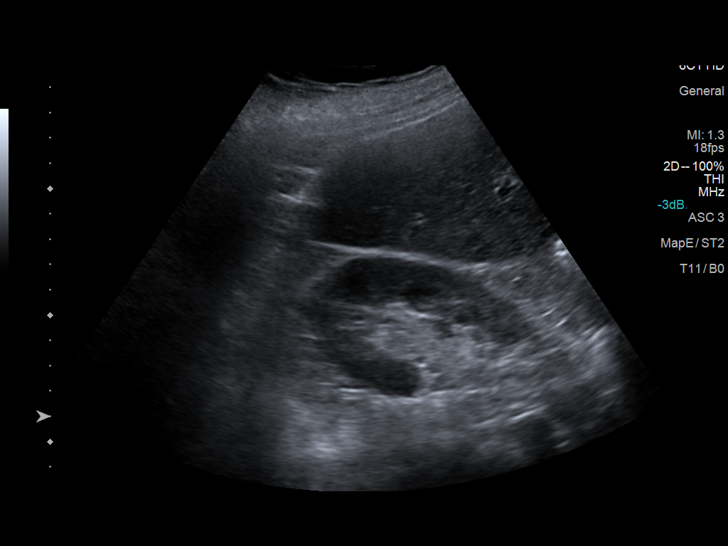
[im 11/22]
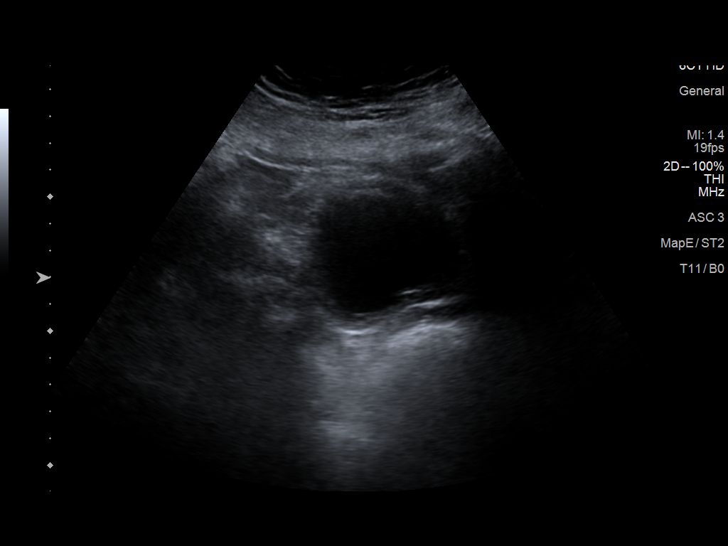
[im 12/22]
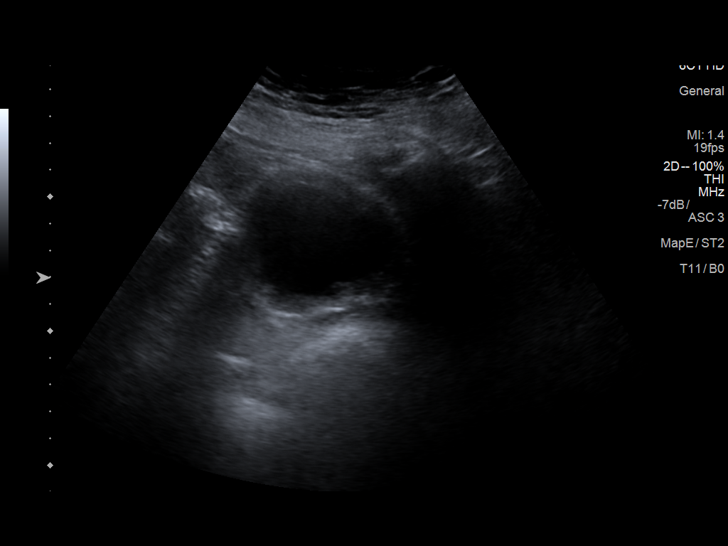
[im 14/22]
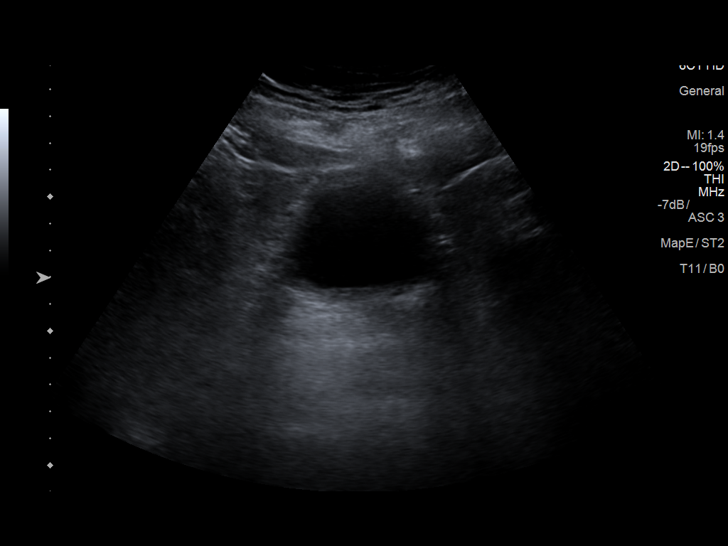
[im 15/22]
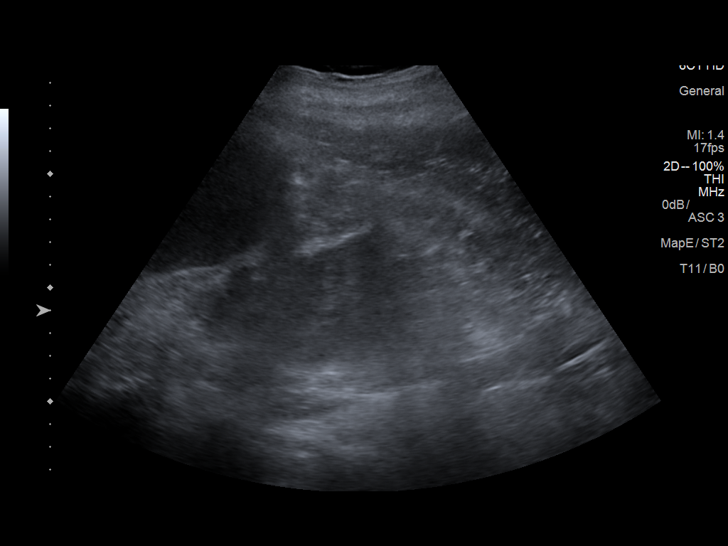
[im 17/22]
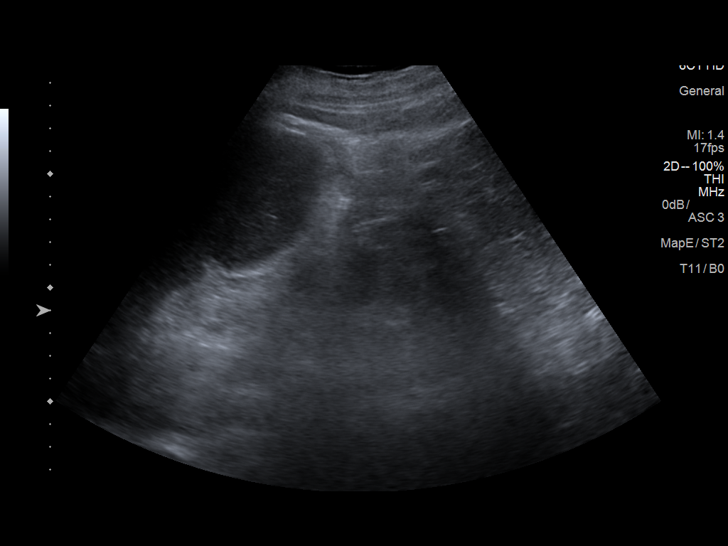
[im 19/22]
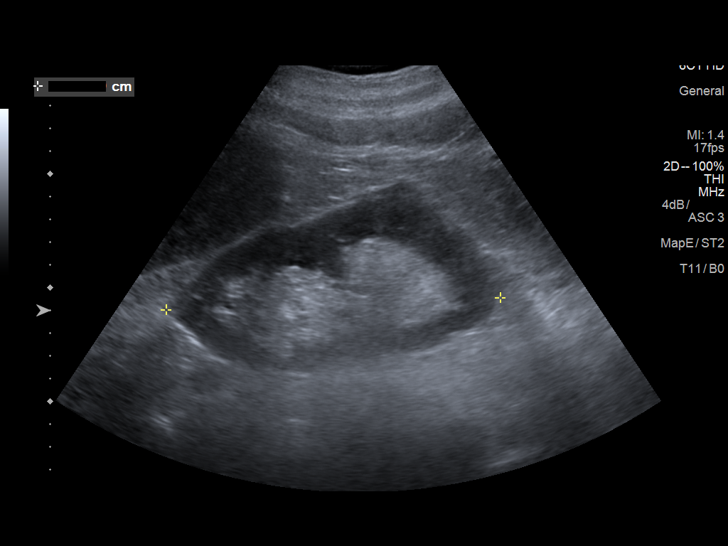
[im 20/22]
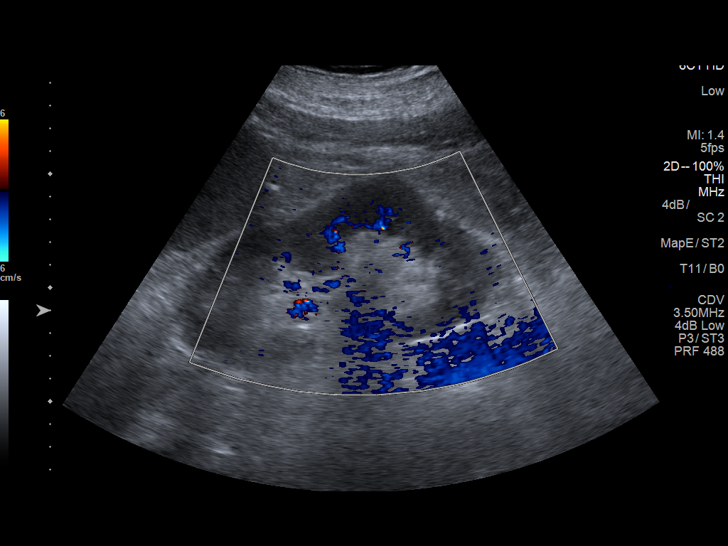
[im 22/22]
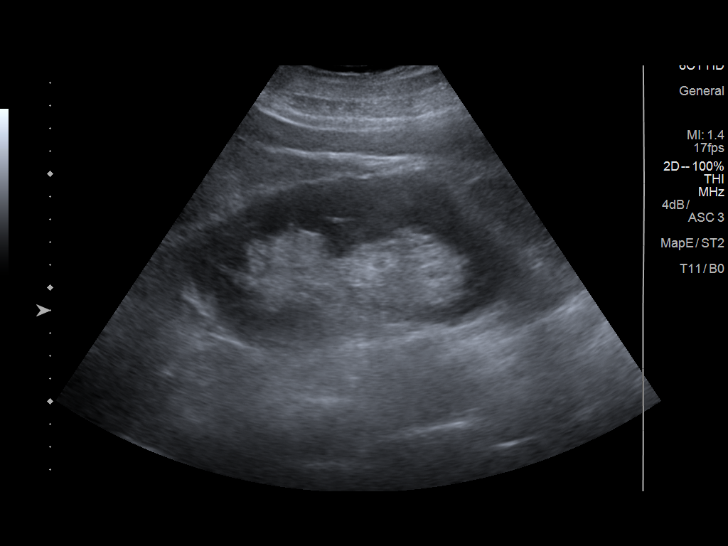

[14 of 22 positions shown; findings below may reference images not displayed]

FINDINGS: Right Kidney:

Length: 13.0 cm. Echogenicity within normal limits. No mass or
hydronephrosis visualized.

Left Kidney:

Length: 14.7 cm. Echogenicity within normal limits. No mass or
hydronephrosis visualized.

Bladder:

Appears normal for degree of bladder distention.
IMPRESSION: Normal examination.

## 2017-01-11 IMAGING — US US ABDOMEN LIMITED
1 series · 14 of 19 positions shown · non-contrast
Comparison: CT abdomen and pelvis yesterday, 09/18/2014,
05/20/2014.

CLINICAL DATA: 74-year-old with 1 week history of right upper
quadrant abdominal pain.

EXAM:
US ABDOMEN LIMITED - RIGHT UPPER QUADRANT

[Series 1: us abdomen limited · 0.30mm/px · 14 of 19 slices shown]
[im 1/19]
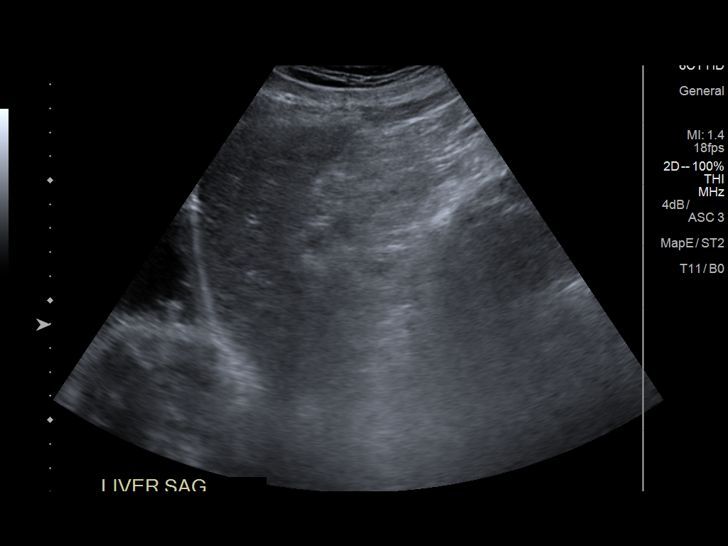
[im 3/19]
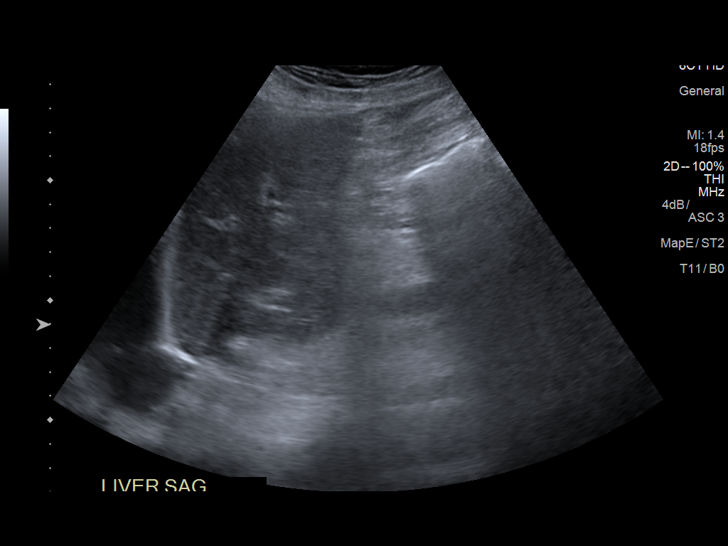
[im 4/19]
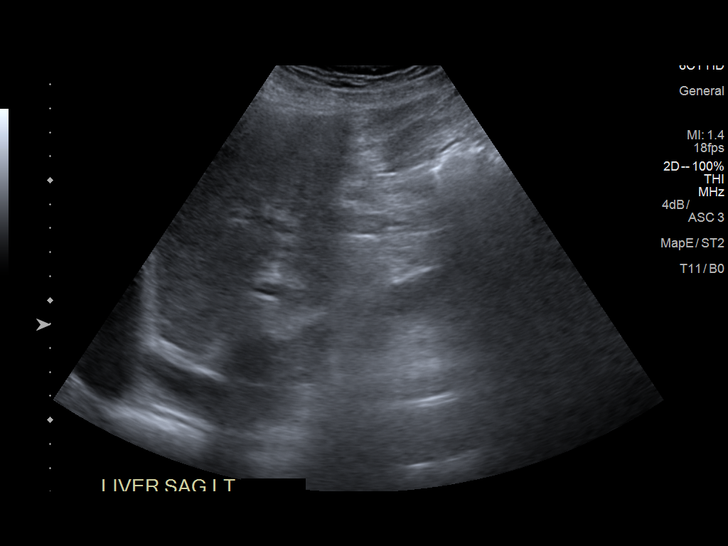
[im 5/19]
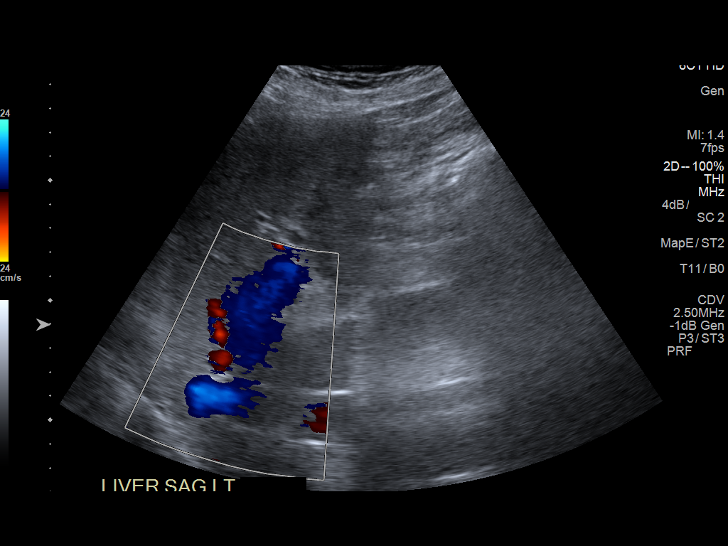
[im 7/19]
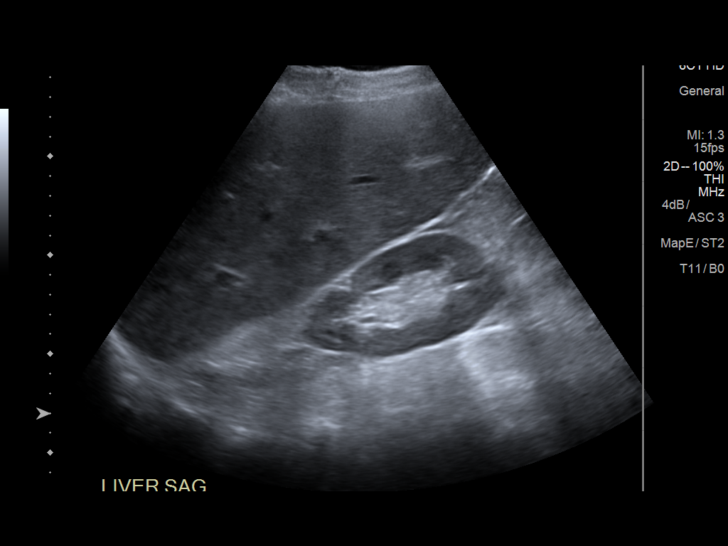
[im 8/19]
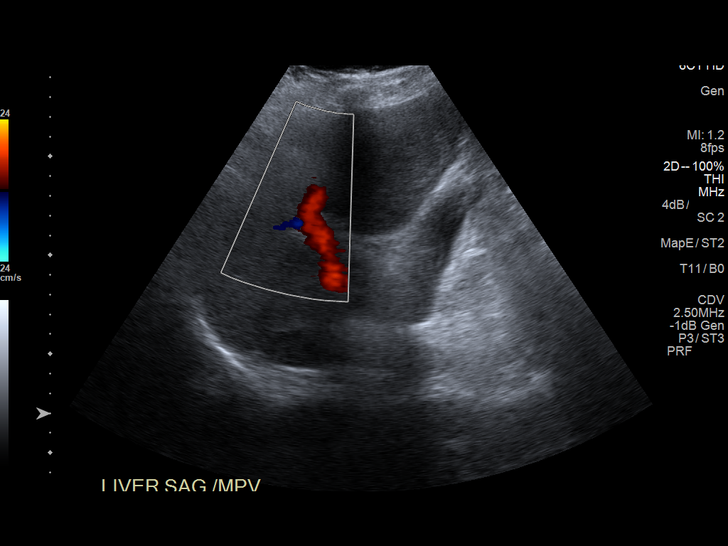
[im 9/19]
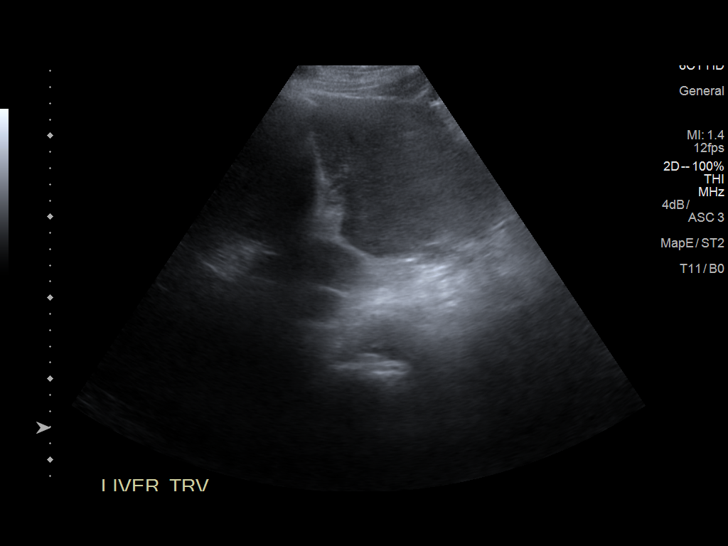
[im 11/19]
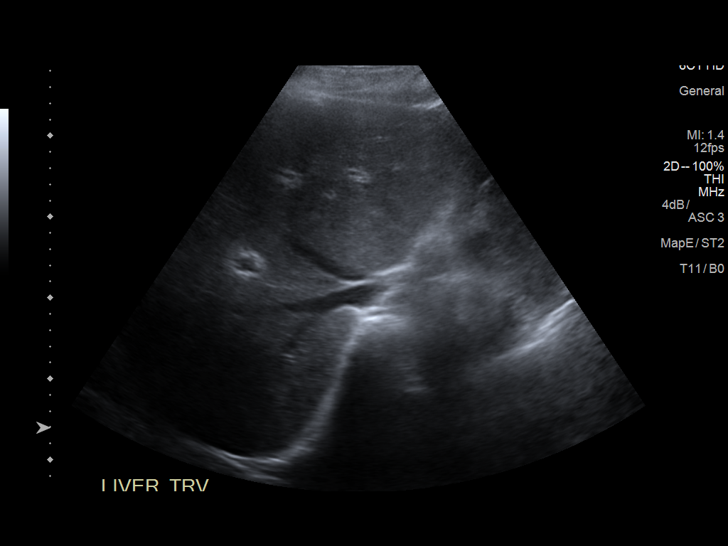
[im 12/19]
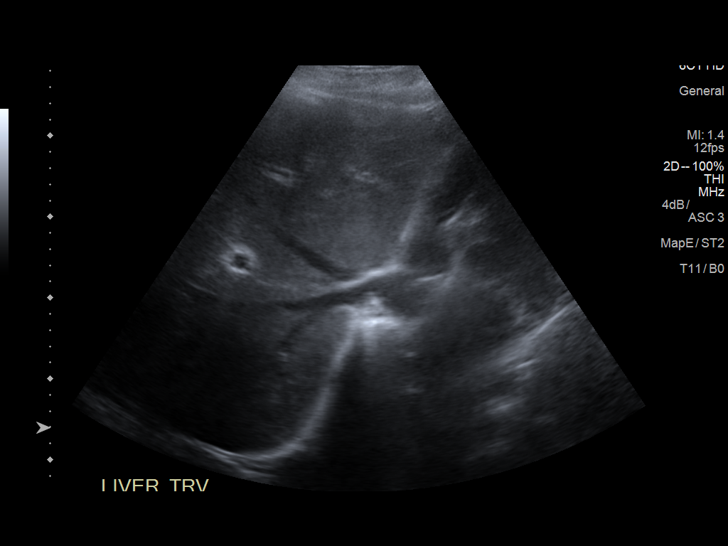
[im 13/19]
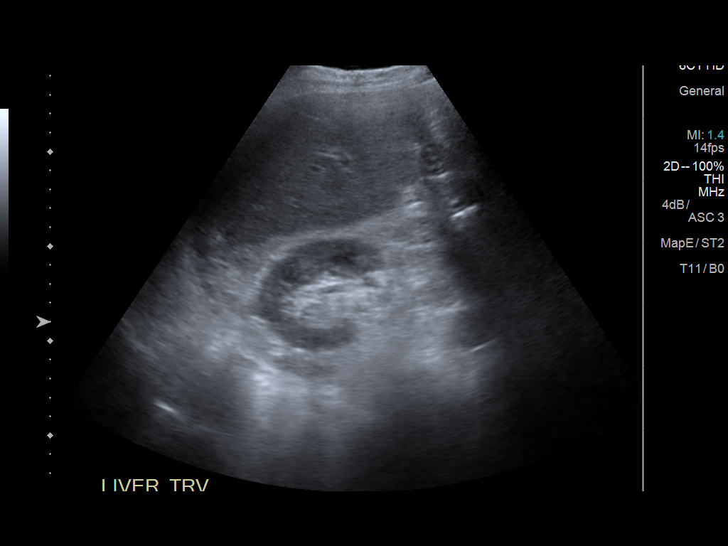
[im 15/19]
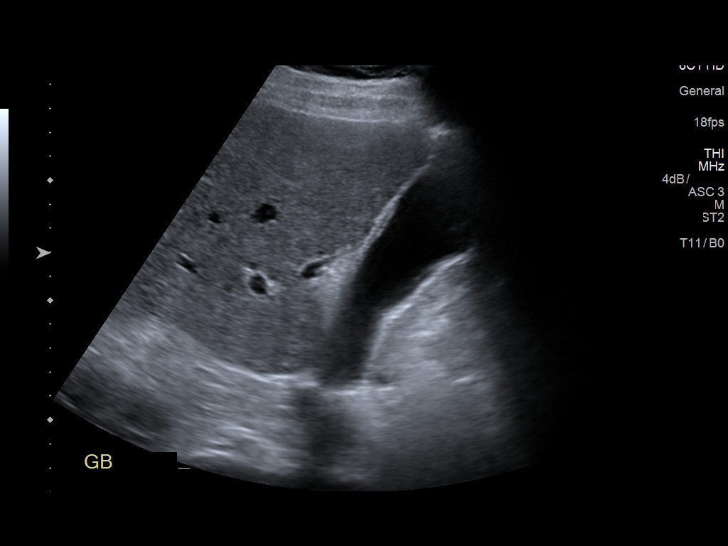
[im 16/19]
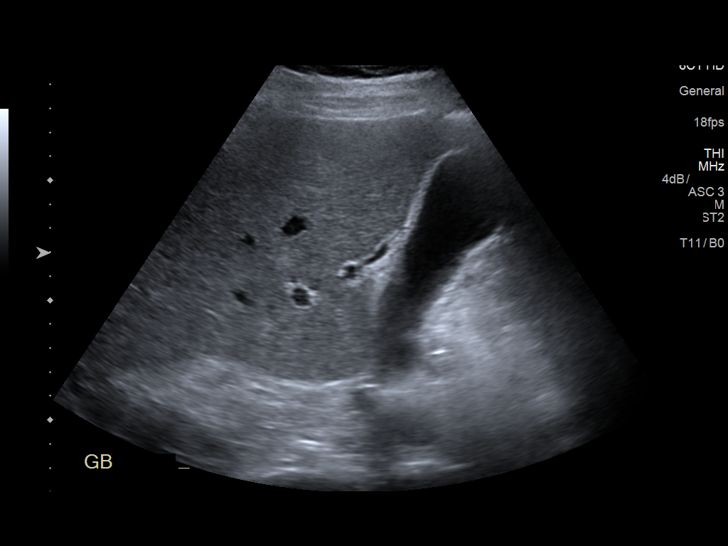
[im 17/19]
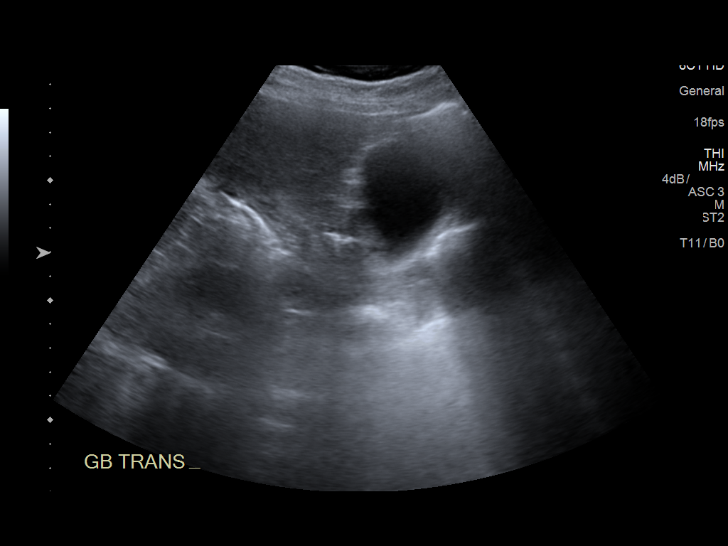
[im 19/19]
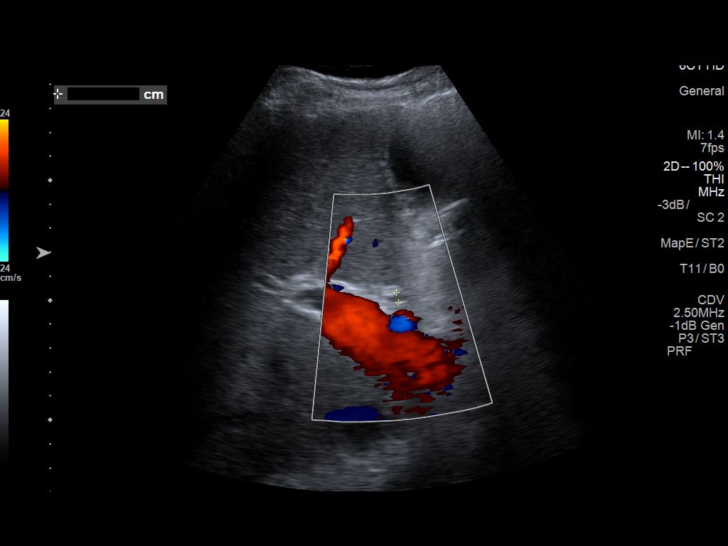

[14 of 19 positions shown; findings below may reference images not displayed]

FINDINGS: Gallbladder:

No shadowing gallstones or echogenic sludge. No gallbladder wall
thickening or pericholecystic fluid. Negative sonographic Murphy
sign according to the ultrasound technologist.

Common bile duct:

Diameter: Approximately 4 mm.

Liver:

Normal size and echotexture without focal parenchymal abnormality.
Patent portal vein with hepatopetal flow.
IMPRESSION: Normal examination.
